# Patient Record
Sex: Female | Born: 1980 | Race: White | Hispanic: No | Marital: Single | Smoking: Current every day smoker
Health system: Southern US, Community
[De-identification: ages and names within clinical notes are randomized; demographics above are authoritative.]

## PROBLEM LIST (undated history)

## (undated) DIAGNOSIS — F32A Depression, unspecified: Secondary | ICD-10-CM

## (undated) DIAGNOSIS — E559 Vitamin D deficiency, unspecified: Secondary | ICD-10-CM

## (undated) DIAGNOSIS — N6320 Unspecified lump in the left breast, unspecified quadrant: Secondary | ICD-10-CM

## (undated) DIAGNOSIS — R319 Hematuria, unspecified: Secondary | ICD-10-CM

## (undated) DIAGNOSIS — F329 Major depressive disorder, single episode, unspecified: Secondary | ICD-10-CM

## (undated) DIAGNOSIS — N898 Other specified noninflammatory disorders of vagina: Secondary | ICD-10-CM

## (undated) DIAGNOSIS — N83209 Unspecified ovarian cyst, unspecified side: Secondary | ICD-10-CM

## (undated) DIAGNOSIS — R309 Painful micturition, unspecified: Secondary | ICD-10-CM

## (undated) HISTORY — PX: TOTAL VAGINAL HYSTERECTOMY: SHX2548

## (undated) HISTORY — DX: Other specified noninflammatory disorders of vagina: N89.8

## (undated) HISTORY — DX: Painful micturition, unspecified: R30.9

## (undated) HISTORY — DX: Hematuria, unspecified: R31.9

## (undated) HISTORY — DX: Major depressive disorder, single episode, unspecified: F32.9

## (undated) HISTORY — DX: Vitamin D deficiency, unspecified: E55.9

## (undated) HISTORY — PX: BREAST BIOPSY: SHX20

## (undated) HISTORY — DX: Depression, unspecified: F32.A

## (undated) HISTORY — DX: Unspecified ovarian cyst, unspecified side: N83.209

## (undated) HISTORY — DX: Unspecified lump in the left breast, unspecified quadrant: N63.20

---

## 2000-12-26 ENCOUNTER — Encounter: Payer: Self-pay | Admitting: *Deleted

## 2000-12-26 ENCOUNTER — Ambulatory Visit (HOSPITAL_COMMUNITY): Admission: RE | Admit: 2000-12-26 | Discharge: 2000-12-26 | Payer: Self-pay | Admitting: *Deleted

## 2001-09-25 ENCOUNTER — Other Ambulatory Visit: Admission: RE | Admit: 2001-09-25 | Discharge: 2001-09-25 | Payer: Self-pay | Admitting: Obstetrics and Gynecology

## 2002-01-10 ENCOUNTER — Ambulatory Visit (HOSPITAL_COMMUNITY): Admission: AD | Admit: 2002-01-10 | Discharge: 2002-01-10 | Payer: Self-pay | Admitting: Obstetrics and Gynecology

## 2002-01-26 ENCOUNTER — Ambulatory Visit (HOSPITAL_COMMUNITY): Admission: RE | Admit: 2002-01-26 | Discharge: 2002-01-26 | Payer: Self-pay | Admitting: Obstetrics and Gynecology

## 2002-03-19 ENCOUNTER — Ambulatory Visit (HOSPITAL_COMMUNITY): Admission: RE | Admit: 2002-03-19 | Discharge: 2002-03-19 | Payer: Self-pay | Admitting: Obstetrics and Gynecology

## 2002-04-01 ENCOUNTER — Ambulatory Visit (HOSPITAL_COMMUNITY): Admission: AD | Admit: 2002-04-01 | Discharge: 2002-04-01 | Payer: Self-pay | Admitting: Obstetrics and Gynecology

## 2002-04-10 ENCOUNTER — Inpatient Hospital Stay (HOSPITAL_COMMUNITY): Admission: AD | Admit: 2002-04-10 | Discharge: 2002-04-13 | Payer: Self-pay | Admitting: Obstetrics and Gynecology

## 2002-07-09 ENCOUNTER — Emergency Department (HOSPITAL_COMMUNITY): Admission: EM | Admit: 2002-07-09 | Discharge: 2002-07-09 | Payer: Self-pay | Admitting: Emergency Medicine

## 2002-07-09 ENCOUNTER — Encounter: Payer: Self-pay | Admitting: Emergency Medicine

## 2002-09-17 ENCOUNTER — Emergency Department (HOSPITAL_COMMUNITY): Admission: EM | Admit: 2002-09-17 | Discharge: 2002-09-17 | Payer: Self-pay | Admitting: Emergency Medicine

## 2003-04-10 ENCOUNTER — Emergency Department (HOSPITAL_COMMUNITY): Admission: EM | Admit: 2003-04-10 | Discharge: 2003-04-10 | Payer: Self-pay | Admitting: Emergency Medicine

## 2003-04-21 ENCOUNTER — Inpatient Hospital Stay (HOSPITAL_COMMUNITY): Admission: EM | Admit: 2003-04-21 | Discharge: 2003-04-24 | Payer: Self-pay | Admitting: Emergency Medicine

## 2003-09-19 ENCOUNTER — Emergency Department (HOSPITAL_COMMUNITY): Admission: EM | Admit: 2003-09-19 | Discharge: 2003-09-19 | Payer: Self-pay | Admitting: Emergency Medicine

## 2003-09-30 ENCOUNTER — Emergency Department (HOSPITAL_COMMUNITY): Admission: EM | Admit: 2003-09-30 | Discharge: 2003-09-30 | Payer: Self-pay | Admitting: Emergency Medicine

## 2003-10-01 ENCOUNTER — Emergency Department (HOSPITAL_COMMUNITY): Admission: EM | Admit: 2003-10-01 | Discharge: 2003-10-02 | Payer: Self-pay | Admitting: Emergency Medicine

## 2004-02-17 ENCOUNTER — Emergency Department (HOSPITAL_COMMUNITY): Admission: EM | Admit: 2004-02-17 | Discharge: 2004-02-17 | Payer: Self-pay | Admitting: Emergency Medicine

## 2005-02-01 ENCOUNTER — Ambulatory Visit (HOSPITAL_COMMUNITY): Admission: RE | Admit: 2005-02-01 | Discharge: 2005-02-01 | Payer: Self-pay | Admitting: Preventative Medicine

## 2005-02-01 IMAGING — CT CT ABDOMEN W/ CM
1 of 3 series · 13 of 32 positions shown, 19 images · IV contrast (CONTRAST)
Comparison: none

CLINICAL DATA: 23-year-old with abdominal pain and fever.  Bilateral pelvic pain.  Diarrhea.
TECHNIQUE: Helical images are performed through the abdomen and pelvis following the administration of oral contrast and during administration of 100 cc Omnipaque 300.  
 CT ABDOMEN WITH CONTRAST:
 Images of the lung bases are unremarkable.  No focal abnormality is seen within the liver, spleen, pancreas, adrenal glands or kidneys.  The gallbladder is present.  No retroperitoneal adenopathy.  Note is made of an umbilical ring.

[Series 251: — · axial · 0.55mm/px · z∈[+1139,+1499]mm · 13 of 84 slices shown, 19 images]
[im 6/84  soft-tissue]
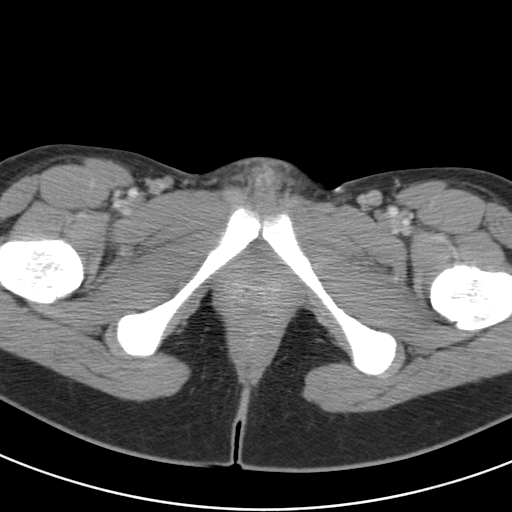
[im 6/84  bone]
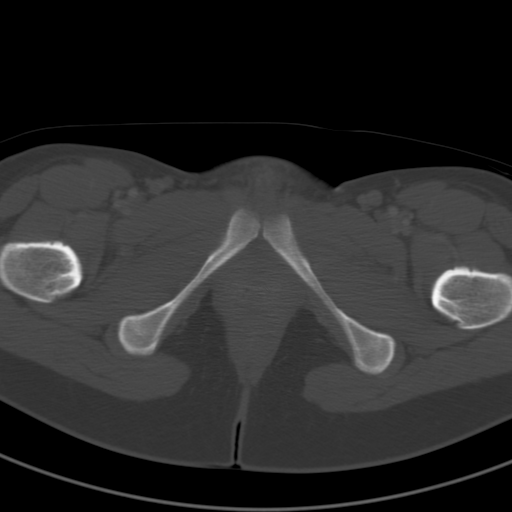
[im 12/84  soft-tissue]
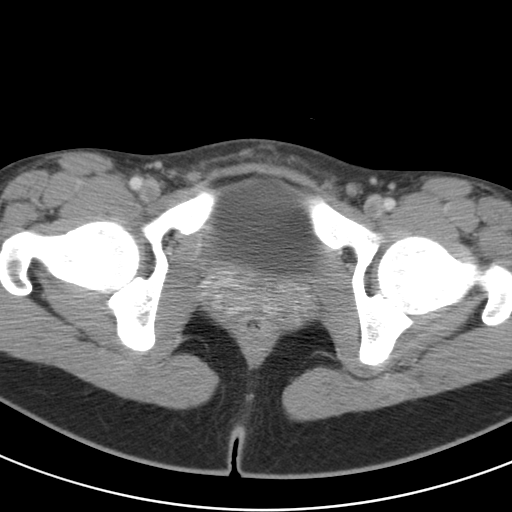
[im 18/84  soft-tissue]
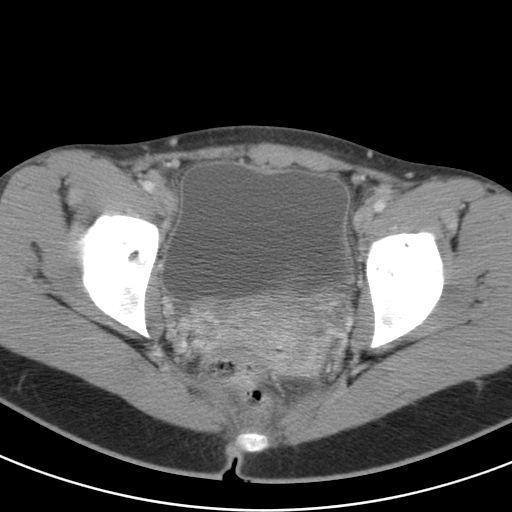
[im 24/84  soft-tissue]
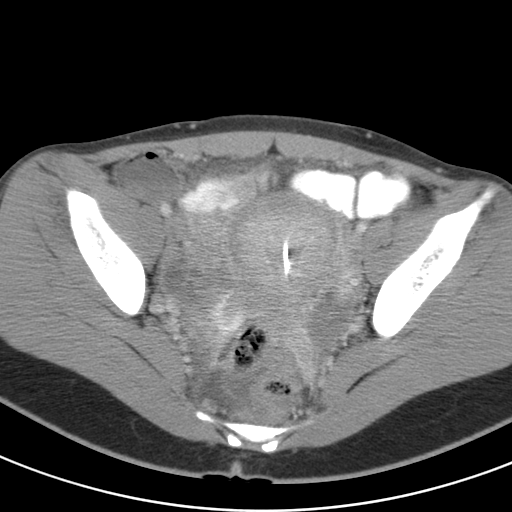
[im 30/84  soft-tissue]
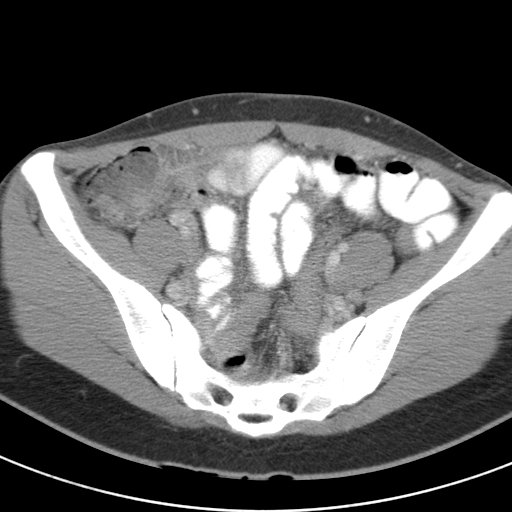
[im 36/84  soft-tissue]
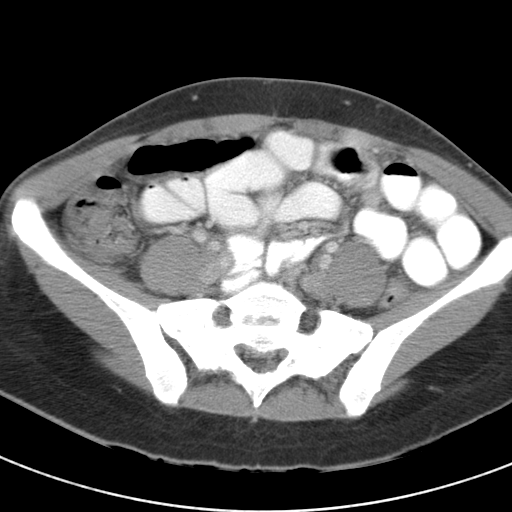
[im 42/84  soft-tissue]
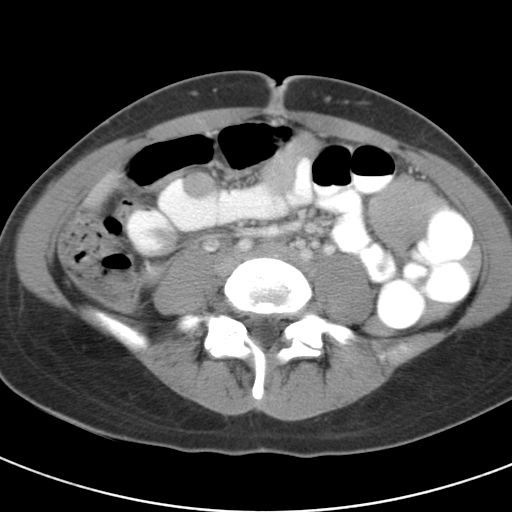
[im 48/84  soft-tissue]
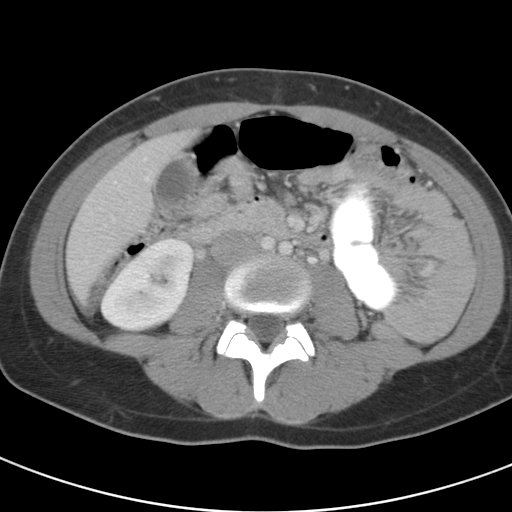
[im 54/84  soft-tissue]
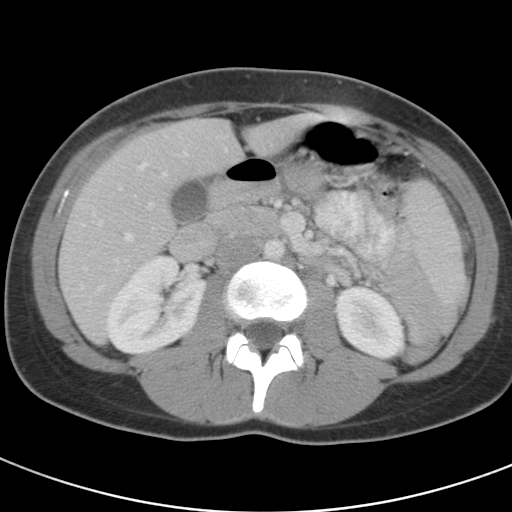
[im 54/84  bone]
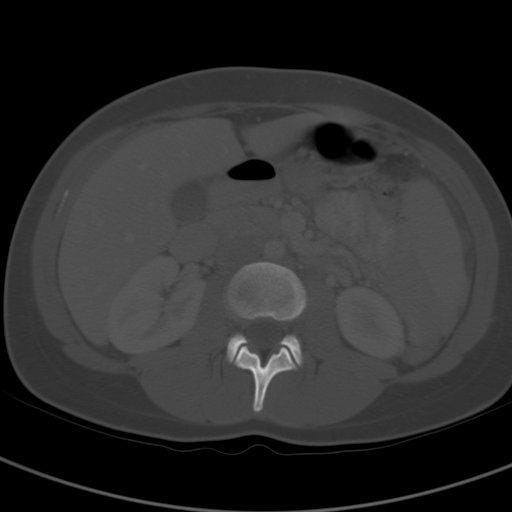
[im 60/84  soft-tissue]
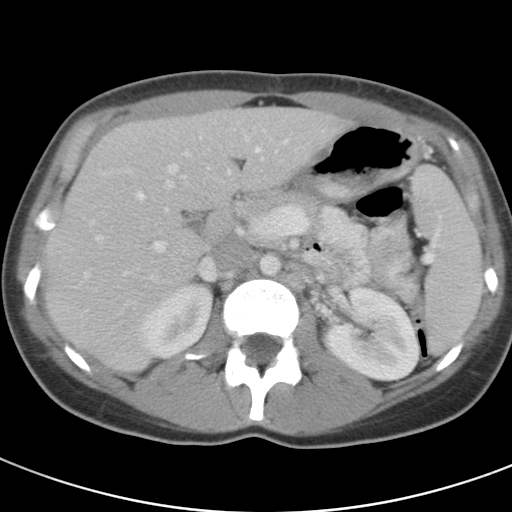
[im 60/84  lung]
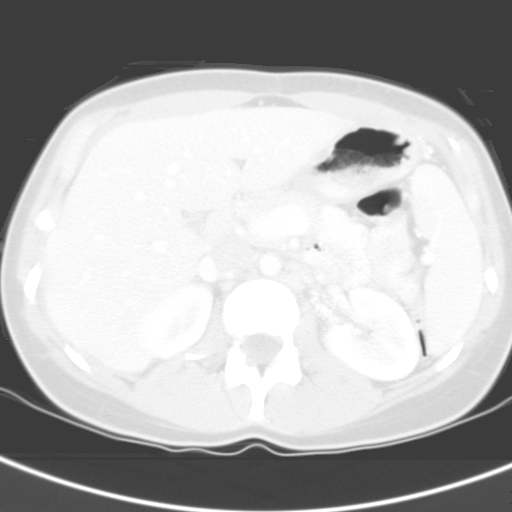
[im 66/84  soft-tissue]
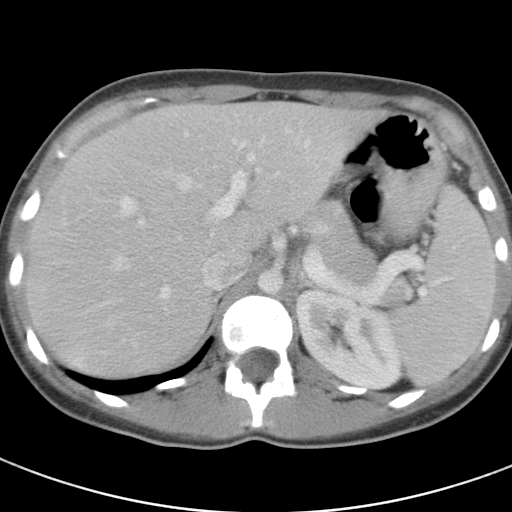
[im 66/84  lung]
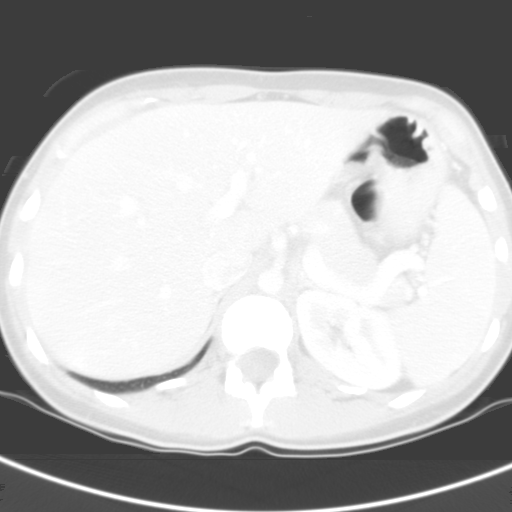
[im 72/84  soft-tissue]
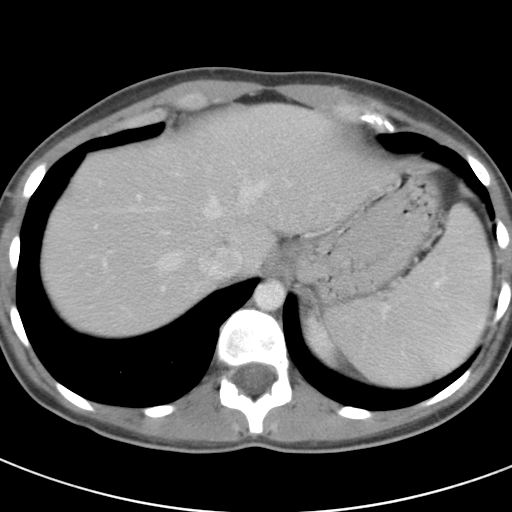
[im 72/84  lung]
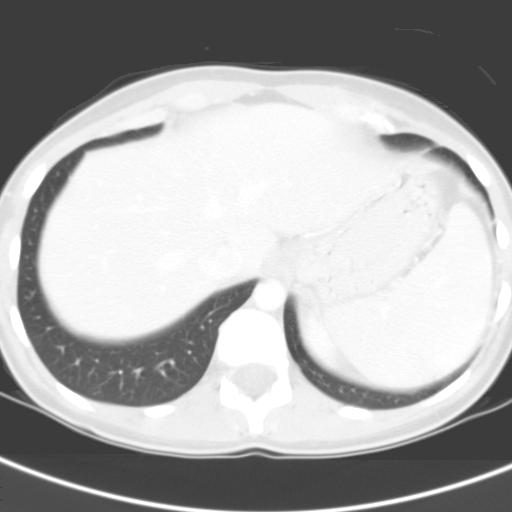
[im 78/84  soft-tissue]
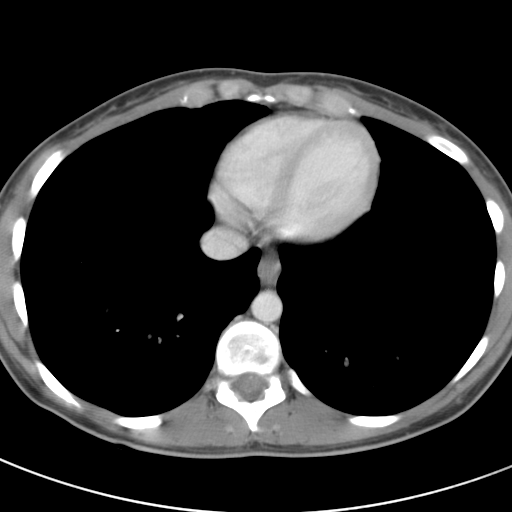
[im 78/84  lung]
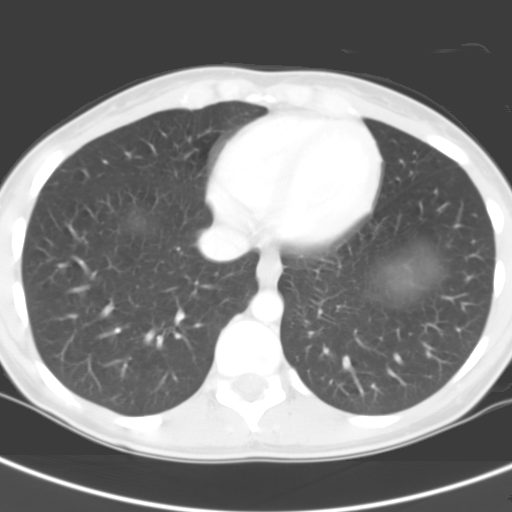

[13 of 32 positions shown; findings below may reference images not displayed]

IMPRESSION: No evidence for acute abnormality of the abdomen.
 CT PELVIS WITH CONTRAST:
 Within the uterus, there is an intrauterine device, which appears to be in good position.  Right ovary is slightly prominent, measuring 4.5 x 4.5 x 4 cm.  Left ovary measures approximately 3.0 x 3.8 cm.  There is a small amount of free pelvic fluid which may be physiologic.  The appendix is not seen but there is no inflammatory change in the right lower quadrant to suggest the presence of acute appendicitis.
IMPRESSION: 1.  Slight prominence of the region of right ovary.  Consider further evaluation with pelvic ultrasound.  
 2.  Small amount of free pelvic fluid may be physiologic.

## 2005-03-15 ENCOUNTER — Encounter: Payer: Self-pay | Admitting: Obstetrics & Gynecology

## 2005-03-15 ENCOUNTER — Ambulatory Visit (HOSPITAL_COMMUNITY): Admission: RE | Admit: 2005-03-15 | Discharge: 2005-03-15 | Payer: Self-pay | Admitting: Obstetrics & Gynecology

## 2005-03-19 ENCOUNTER — Emergency Department (HOSPITAL_COMMUNITY): Admission: EM | Admit: 2005-03-19 | Discharge: 2005-03-20 | Payer: Self-pay | Admitting: Emergency Medicine

## 2005-03-20 ENCOUNTER — Observation Stay (HOSPITAL_COMMUNITY): Admission: EM | Admit: 2005-03-20 | Discharge: 2005-03-20 | Payer: Self-pay | Admitting: Emergency Medicine

## 2005-03-21 ENCOUNTER — Emergency Department (HOSPITAL_COMMUNITY): Admission: EM | Admit: 2005-03-21 | Discharge: 2005-03-21 | Payer: Self-pay | Admitting: Emergency Medicine

## 2005-06-18 ENCOUNTER — Emergency Department (HOSPITAL_COMMUNITY): Admission: EM | Admit: 2005-06-18 | Discharge: 2005-06-19 | Payer: Self-pay | Admitting: Emergency Medicine

## 2005-06-20 ENCOUNTER — Emergency Department (HOSPITAL_COMMUNITY): Admission: EM | Admit: 2005-06-20 | Discharge: 2005-06-20 | Payer: Self-pay | Admitting: Emergency Medicine

## 2006-06-23 ENCOUNTER — Emergency Department (HOSPITAL_COMMUNITY): Admission: EM | Admit: 2006-06-23 | Discharge: 2006-06-23 | Payer: Self-pay | Admitting: Emergency Medicine

## 2006-07-17 ENCOUNTER — Emergency Department (HOSPITAL_COMMUNITY): Admission: EM | Admit: 2006-07-17 | Discharge: 2006-07-17 | Payer: Self-pay | Admitting: Emergency Medicine

## 2006-07-19 ENCOUNTER — Emergency Department (HOSPITAL_COMMUNITY): Admission: EM | Admit: 2006-07-19 | Discharge: 2006-07-19 | Payer: Self-pay | Admitting: Emergency Medicine

## 2006-11-30 ENCOUNTER — Ambulatory Visit (HOSPITAL_COMMUNITY): Admission: RE | Admit: 2006-11-30 | Discharge: 2006-11-30 | Payer: Self-pay | Admitting: Obstetrics and Gynecology

## 2007-05-16 ENCOUNTER — Other Ambulatory Visit: Admission: RE | Admit: 2007-05-16 | Discharge: 2007-05-16 | Payer: Self-pay | Admitting: Obstetrics and Gynecology

## 2008-06-19 ENCOUNTER — Other Ambulatory Visit: Admission: RE | Admit: 2008-06-19 | Discharge: 2008-06-19 | Payer: Self-pay | Admitting: Obstetrics and Gynecology

## 2009-05-07 ENCOUNTER — Ambulatory Visit (HOSPITAL_COMMUNITY): Admission: RE | Admit: 2009-05-07 | Discharge: 2009-05-08 | Payer: Self-pay | Admitting: Obstetrics & Gynecology

## 2009-05-07 ENCOUNTER — Encounter: Payer: Self-pay | Admitting: Obstetrics & Gynecology

## 2009-06-02 ENCOUNTER — Encounter (INDEPENDENT_AMBULATORY_CARE_PROVIDER_SITE_OTHER): Payer: Self-pay | Admitting: General Surgery

## 2009-06-02 ENCOUNTER — Ambulatory Visit (HOSPITAL_COMMUNITY): Admission: RE | Admit: 2009-06-02 | Discharge: 2009-06-02 | Payer: Self-pay | Admitting: General Surgery

## 2010-08-10 ENCOUNTER — Emergency Department (HOSPITAL_COMMUNITY)
Admission: EM | Admit: 2010-08-10 | Discharge: 2010-08-11 | Payer: Self-pay | Source: Home / Self Care | Admitting: Emergency Medicine

## 2010-11-24 LAB — URINALYSIS, ROUTINE W REFLEX MICROSCOPIC
Bilirubin Urine: NEGATIVE
Ketones, ur: NEGATIVE mg/dL
Nitrite: NEGATIVE
Protein, ur: NEGATIVE mg/dL
Urobilinogen, UA: 0.2 mg/dL (ref 0.0–1.0)
pH: 6 (ref 5.0–8.0)

## 2010-11-24 LAB — GC/CHLAMYDIA PROBE AMP, GENITAL: Chlamydia, DNA Probe: NEGATIVE

## 2010-12-19 LAB — URINALYSIS, ROUTINE W REFLEX MICROSCOPIC
Glucose, UA: NEGATIVE mg/dL
Ketones, ur: NEGATIVE mg/dL
Leukocytes, UA: NEGATIVE
Protein, ur: NEGATIVE mg/dL
Urobilinogen, UA: 0.2 mg/dL (ref 0.0–1.0)

## 2010-12-19 LAB — DIFFERENTIAL
Lymphocytes Relative: 33 % (ref 12–46)
Lymphs Abs: 2.6 10*3/uL (ref 0.7–4.0)
Monocytes Relative: 5 % (ref 3–12)
Neutrophils Relative %: 58 % (ref 43–77)

## 2010-12-19 LAB — COMPREHENSIVE METABOLIC PANEL
Albumin: 4.3 g/dL (ref 3.5–5.2)
Alkaline Phosphatase: 49 U/L (ref 39–117)
BUN: 7 mg/dL (ref 6–23)
CO2: 24 mEq/L (ref 19–32)
Chloride: 108 mEq/L (ref 96–112)
Creatinine, Ser: 0.86 mg/dL (ref 0.4–1.2)
GFR calc non Af Amer: >60 mL/min (ref >60)
Potassium: 3.9 mEq/L (ref 3.5–5.1)
Total Bilirubin: 0.6 mg/dL (ref 0.3–1.2)

## 2010-12-19 LAB — TYPE AND SCREEN: ABO/RH(D): O NEG

## 2010-12-19 LAB — CBC
HCT: 42.4 % (ref 36.0–46.0)
Hemoglobin: 15.1 g/dL — ABNORMAL HIGH (ref 12.0–15.0)
MCV: 85.1 fL (ref 78.0–100.0)
Platelets: 120 10*3/uL — ABNORMAL LOW (ref 150–400)
Platelets: 137 10*3/uL — ABNORMAL LOW (ref 150–400)
RBC: 4.18 MIL/uL (ref 3.87–5.11)
RBC: 4.99 MIL/uL (ref 3.87–5.11)
WBC: 6 10*3/uL (ref 4.0–10.5)
WBC: 7.8 10*3/uL (ref 4.0–10.5)

## 2010-12-19 LAB — URINE MICROSCOPIC-ADD ON

## 2011-01-26 NOTE — Op Note (Signed)
NAMEJENNFER, GASSEN             ACCOUNT NO.:  [id number]   MEDICAL RECORD NO.:  [id number]          PATIENT TYPE:  AMB   LOCATION:  DAY                           FACILITY:  [facility]   PHYSICIAN:  Lazaro Arms, M.D.   DATE OF BIRTH:  August 02, 1981   DATE OF PROCEDURE:  05/07/2009  DATE OF DISCHARGE:                               OPERATIVE REPORT   PREOPERATIVE DIAGNOSES:  1. Dysfunctional uterine bleeding.  2. Dysmenorrhea.  3. Chronic right lower quadrant and midline pelvic pain.  4. Dyspareunia.   POSTOPERATIVE DIAGNOSES:  1. Dysfunctional uterine bleeding.  2. Dysmenorrhea.  3. Chronic right lower quadrant and midline pelvic pain.  4. Dyspareunia.   PROCEDURE:  Total vaginal hysterectomy.   SURGEON:  Lazaro Arms, MD   ANESTHESIA:  General endotracheal.   FINDINGS:  Normal uterus, tubes, and ovaries.   DESCRIPTION OF OPERATION:  The patient taken to the operating room,  placed in supine position where she underwent general endotracheal  anesthesia, placed in dorsal supine position in candy cane stirrups.  She was prepped and draped in usual sterile fashion.  A Foley catheter  was placed in the bladder.  A weighted speculum was placed.  Cervix was  grasped.  A 0.5% Marcaine with 1:200,000 epinephrine was injected in the  cervix for hemostasis.  A circumferential incision was made about the  cervix with electrocautery unit.  The posterior cul-de-sac was entered  without difficulty.  The uterus was clamped, cut and suture ligated.  The cardinal ligament was clamped, cut, and suture ligated and held.  The anterior cul-de-sac was entered sharply.  The anterior and posterior  leaves of broad ligament were plicated.  Uterine vessels were clamped,  cut, and suture ligated bilaterally.  Serial pedicles were taken up the  fundus, each pedicle being clamped, cut, and suture ligated.  The left  utero-ovarian ligaments was cross clamped and suture ligated.  Left  ovary was normal.   There was no cyst noted, adhesions, or abnormalities  at all.  No endometriosis and so was left in situ.  The right utero-  ovarian ligaments were cross clamped and sutured.  The infundibulopelvic  ligament on the right was then cross clamped.  The tube and ovary were  removed.  There was good hemostasis.  Pelvis was irrigated vigorously.  The anterior and posterior vagina were closed side-to-side with  interrupted sutures with good hemostasis.  The patient tolerated the  procedure well.  She experienced 100 mL of blood loss, taken to recovery  room in good and stable condition.  All counts correct x3.  She received  Ancef prophylactically.      Lazaro Arms, M.D.  Electronically Signed     LHE/MEDQ  D:  05/07/2009  T:  05/08/2009  Job:  [id number]

## 2011-01-29 NOTE — Discharge Summary (Signed)
Jane Morgan, Jane Morgan                       ACCOUNT NO.:  [id number]   MEDICAL RECORD NO.:  [id number]                   PATIENT TYPE:  INP   LOCATION:  A309                                 FACILITY:  [facility]   PHYSICIAN:  Hanley Hays. Dechurch, M.D.           DATE OF BIRTH:  1981-03-29   DATE OF ADMISSION:  04/21/2003  DATE OF DISCHARGE:  04/24/2003                                 DISCHARGE SUMMARY   DISCHARGE DIAGNOSES:  1. Acute pyelonephritis, cultures pending.  2. Thrombocytopenia.  3. Anxiety disorder.  4. Tobacco abuse.   DISPOSITION:  The patient discharged to home.  Followup with Dr. Lodema Hong in  one week.   DISCHARGE MEDICATIONS:  1. Keflex 500 mg q.i.d. to complete a 2-week course of antibiotics.  2. Darvocet-N 100 q.6-8 hours p.r.n. pain, #30.   HOSPITAL COURSE:  A 30 year old Caucasian female in usual state of health  until about 1 month prior when she developed UTI symptoms.  She was seen in  an urgent care center and given antibiotics.  Apparently her symptoms  improved but then recurred 3 weeks later.  She was seen in the emergency  room and given Levaquin on July 28.  She did not fill the prescription;  however as it turns out she grew E. coli at that time which was resistant to  quinolones.  She presented on August 8 complaining of back pain, fever and  chills.  She had a mild leukocytosis and exam consistent with  pyelonephritis.  Blood and urine cultures were obtained and are pending at  the time of dictation.  She did defervesced from Rocephin.  Based on the  previous cultures she was switched to Keflex.  She insisted on being  discharged versus AMA.  We discharged her home with antibiotics.  She  promised to follow up should there be any recurring fever, chills, pain,  nausea, unable to tolerate antibiotics, hematuria, etc.  She was also noted  to be moderately thrombocytopenic.  Her platelet count was 145 on admission  and 117 at the time of discharge.   She had no evidence of thrombocytopenia  clinically.  This is probably related to sepsis versus antibiotics or  doubtful Vioxx.  She is being discharged with the regimen as noted above.  The plan was discussed with the patient who has reasonable understanding and  it was discussed with a friend who was present as well.   PHYSICAL EXAMINATION:  Exam at time of discharge reveals a somewhat anxious  white female.  Temperature is 97.5, blood pressure is 90/60.  LUNGS:  Clear to auscultation.  She has some left costovertebral angle  tenderness with percussion although this is improved.  She appears to be in  no distress otherwise.  She has no rash, lesion or breakdown.  She has no edema.  NEURO:  Neurological exam at visitation to be somewhat anxious but otherwise  nonfocal.   PLAN:  As  noted above.                                               Hanley Hays Josefine Class, M.D.    FED/MEDQ  D:  04/24/2003  T:  04/24/2003  Job:  [id number]   cc:   Milus Mallick. Lodema Hong, M.D.  [address]  [place], [place] [address]  Fax: [phone]

## 2011-01-29 NOTE — H&P (Signed)
Jane Morgan, Jane Morgan                       ACCOUNT NO.:  [id number]   MEDICAL RECORD NO.:  [id number]                   PATIENT TYPE:  INP   LOCATION:  A417                                 FACILITY:  [facility]   PHYSICIAN:  Tilda Burrow, M.D.              DATE OF BIRTH:  1981-06-22   DATE OF ADMISSION:  04/10/2002  DATE OF DISCHARGE:                                HISTORY & PHYSICAL   ADMISSION DIAGNOSIS:  1. Pregnancy at 38 weeks' gestation.  2. Elective induction secondary to maternal request due to pelvic pressure.   HISTORY OF PRESENT ILLNESS:  This 30 year old female, gravida 5, para 2, AB  2, two prior vaginal deliveries, was admitted after pregnancy course  followed through out office.  Isaiah has been followed in our office through  a straightforward pregnancy course through a multiplicity of visits, 16 to  date.  She has had more pelvic discomfort than average and requests  induction at this time due to insomnia that persists despite two Ambien per  night.  Cervix is favorable at 1-2 cm, 50%, -2, posterior but soft.  The  patient is admitted at 43 weeks' gestation for balloon cervical ripening and  Pitocin induction of labor.  The patient has had two prior vaginal  deliveries at 36 and 37 weeks after eight-hour and four-hour labors,  respectively.  The patient is aware that all the usual risks of pregnancy  management can occur with induced deliveries, including need for emergency  delivery by cesarean or vaginal means.   PAST MEDICAL HISTORY:  Benign.   PAST SURGICAL HISTORY:  D&C, right ovarian cystectomy.   ALLERGIES:  None.   SOCIAL HISTORY:  Lives with boyfriend.  Stable relationship.   PHYSICAL EXAMINATION:  VITAL SIGNS:  Height 5 feet 3 inches, weight 160,  blood pressure 120/63, fundal height 39 cm.  PELVIC:  Cervix 1-2, 50%, -2, posterior and soft.   PRENATAL LABORATORY DATA:  Blood type O negative, with RhoGAM administered  11/28/01.  Pap smear class I.   Rubella immunity present.  Hepatitis, HIV, GC,  Chlamydia, RPR all negative.  Down syndrome risk 1 in 61 with amniocentesis  showing normal XX female infant.    PLAN:  The patient is admitted at this time for Pitocin induction in a.m.  with epidural planned.   Additionally, the patient plans IUD for contraception and to bottle-feed the  infant.                                               Tilda Burrow, M.D.    JVF/MEDQ  D:  04/10/2002  T:  04/11/2002  Job:  [id number]   cc:   [facility]

## 2011-01-29 NOTE — Consult Note (Signed)
[facility]  Patient:    REAGHAN, KAWA Visit Number: [id number] MRN: [id number]          Service Type: OBS Location: 4A A415 01 Attending Physician:  Tilda Burrow Dictated by:   Duane Lope, M.D. Proc. Date: 04/01/02 Admit Date:  04/01/2002 Discharge Date: 04/01/2002                            Consultation Report  The patient is a 30 year old white female, gravida 5, para 2, abortus 3, with an estimated date of delivery of April 25, 2002, at 36-1/[redacted] weeks gestation, complaining of contractions and questionable rupture of membranes.  She was having some mild uterine activity.  The cervix really had not changed any from last week.  It was 2 and 50 in the office last week.  She has a reactive NST. The sterile speculum exam reveals no amniotic fluid visually even with coughing and Valsalva.  Nitrazine negative and fern negative.  She was checked two-and-a-half hours later and was unchanged and actually the contractions had stopped.  She was discharged to home to follow up in the office as scheduled. She was given instructions to call the office or return if needed. Dictated by:   Duane Lope, M.D. Attending Physician:  Tilda Burrow DD:  04/01/02 TD:  04/05/02 Job: [id number]

## 2011-01-29 NOTE — Op Note (Signed)
   NAMEBIANNCA, SCANTLIN                       ACCOUNT NO.:  [id number]   MEDICAL RECORD NO.:  [id number]                   PATIENT TYPE:  INP   LOCATION:  A417                                 FACILITY:  [facility]   PHYSICIAN:  Tilda Burrow, M.D.              DATE OF BIRTH:  1981/04/24   DATE OF PROCEDURE:  04/11/2002  DATE OF DISCHARGE:  04/13/2002                                 OPERATIVE REPORT   PROCEDURE:  Epidural catheter placement.   SURGEON:  Tilda Burrow, M.D.   DETAILS OF PROCEDURE:  The patient was placed in the sitting position and  flexed forward.  The patient had marked difficulty being still due to the  discomforts of labor; she was 6 to 7 cm at the time.  Fluid hydration had  been performed.  The patient had easily identified landmarks.  On the first  attempt at loss-of-resistance technique, I identified an appropriate  epidural space by all indications.  A 5 cc test dose of 1.5% Xylocaine with  epinephrine was instilled through the Tuohy needle and then the epidural  catheter easily inserted 3 cm.  Needle was removed, catheter taped to the  back and infusion continued with a 9 cc bolus at 12 cc/hr.  Interestingly,  the patient had continued discomfort in the abdomen but had marked relief in  the perineal area as evidenced by marked perineal relaxation at time of  subsequent cervical exams.  The patient's pain level subjectively was  slightly improved but tolerated poorly by the patient.  See delivery note  for details of catheter removal.                                               Tilda Burrow, M.D.    JVF/MEDQ  D:  04/13/2002  T:  04/19/2002  Job:  [id number]

## 2011-01-29 NOTE — H&P (Signed)
NAMERHODESIA, STANGER             ACCOUNT NO.:  [id number]   MEDICAL RECORD NO.:  [id number]          PATIENT TYPE:  AMB   LOCATION:  DAY                           FACILITY:  [facility]   PHYSICIAN:  Lazaro Arms, M.D.   DATE OF BIRTH:  11/23/1980   DATE OF ADMISSION:  DATE OF DISCHARGE:  LH                                HISTORY & PHYSICAL   HISTORY OF PRESENT ILLNESS:  Jane Morgan is a 30 year old gravida 5, para 2,  abortus 3, living 2 who has been followed now for the past month or so with  an enlarging ovarian mass.  She has been having right lower quadrant pain  and was seen through the Urgent Care Center and the [facility] and  eventually got back to [place].  On examination she has a 4.6 x 4 ovarian mass  which to me appears to be in the midline which would be the left, I can see  her right ovary and I do not really appreciate the fact that this indeed is  separate from her right ovary though she states her pain is in the right  lower quadrant, to me it is more in the midline and right behind the uterus  and to my feeling it is a left ovarian cyst.  As a result she is admitted  for laparoscopic evaluation to do the appropriate surgery.  She states to  err on the side of removing the whole ovary because she has had it operated  on before and does not want to continue to have problems.  We will do either  an ovarian cystectomy or oophorectomy based on the clinical picture at the  time of surgery and she certainly understands that.  Additionally, she is on  the Mirena IUD, has irregular bleeding with that but that is nothing new.  We have had her own Megace in the past to control irregular bleeding and  that certainly has worked nicely.  Again, she is admitted because of ongoing  acute pain and she is unable to function otherwise.   PAST MEDICAL HISTORY:  Molluscum contagiosum.   PAST SURGICAL HISTORY:  She has had ovarian cyst surgery and D&C's.   PAST OBSTETRICAL:  Three  vaginal deliveries and two missed ABs.   ALLERGIES:  None.   MEDICATIONS:  Medications currently are Vicoprofen for pain and she has a  Mirena IUD.   REVIEW OF SYSTEMS:  As per HPI otherwise negative.   PHYSICAL EXAMINATION:  VITAL SIGNS:  Weight is 125 pounds, blood pressure is  110/70.  HEENT:  Unremarkable.  THYROID:  Normal.  LUNGS:  Clear.  HEART:  Regular rate and rhythm without murmurs, regurgitation, or gallops.  BREASTS:  Without mass, discharge, skin changes.  ABDOMEN:  Benign.  No hepatosplenomegaly or mass.  GU:  She has normal external genitalia.  Vagina is pink and moist without  discharge.  Cervix is parous without lesions.  Uterus normal size.  She is  tender in the mid plane.  Palpation of right lower quadrant is basically  negative to my exam.  EXTREMITIES:  Warm.  No edema.  NEUROLOGIC EXAM:  Grossly intact.   IMPRESSION:  1.  A 4 x 4 cm ovarian cyst, left.  2.  Right lower quadrant pain.   PLAN:  There is some discrepancy between the side I think it is on based on  ultrasound and her symptoms.  It is really midline but I can see her right  ovary clearly on ultrasound.  In any event, we are going to do what is in  Jane Morgan's best interest.  She does say that if there is any question to go  ahead and take the ovary out because she has had so much difficulty in the  past.  We will decide at the time of surgery whether an ovarian cystectomy  or an oophorectomy is most appropriate for her long-term care.  She  understands the risks, benefits, indications, alternatives and will proceed.       LHE/MEDQ  D:  03/14/2005  T:  03/14/2005  Job:  [id number]

## 2011-01-29 NOTE — H&P (Signed)
Jane Morgan, Jane Morgan                       ACCOUNT NO.:  [id number]   MEDICAL RECORD NO.:  [id number]                   PATIENT TYPE:  INP   LOCATION:  A309                                 FACILITY:  [facility]   PHYSICIAN:  Hanley Hays. Dechurch, M.D.           DATE OF BIRTH:  1981-08-03   DATE OF ADMISSION:  04/21/2003  DATE OF DISCHARGE:                                HISTORY & PHYSICAL   HISTORY OF PRESENT ILLNESS:  The patient is a 30 year old Caucasian female  without primary care Jane Morgan with a past history of UTI who was seen in the  emergency room on April 10, 2003.  At that time she received Levaquin and  given a prescription, which she did not fill.  She presents now with a 24-  hour history of progressive left flank pain, chills, fever, nausea.  Her  urinalysis reveals too numerous to count white cells and bacteriuria.  She  has a leukocytosis and a fever of 101.3.  Urine culture on April 10, 2003,  grew E. coli which was resistant to quinolones but sensitive to Rocephin and  Macrodantin.  The patient is being admitted pyelonephritis.   PAST MEDICAL HISTORY/PAST SURGICAL HISTORY:  1. She is gravida 3, para 3, abortus 1.  2. History of ovarian cyst rupture.  3. Adhesiolysis.   SOCIAL HISTORY:  She is unemployed.  Smokes one pack per day.  Denies any  alcohol or drug use.   REVIEW OF SYSTEMS:  Periods are regular.  She denies pregnancy.  She states  I am gay.  No previous history of UTI or pyelonephritis.  No renal  problems.   REVIEW OF SYSTEMS:  Otherwise negative.   FAMILY MEDICAL HISTORY:  Noncontributory to this admission.   PHYSICAL EXAMINATION:  GENERAL:  Well-developed, well-nourished white female  who is in moderate distress secondary to pain.  She is alert and oriented.  VITAL SIGNS:  Temperature is 101.3, blood pressure is 99/62, pulse is 100,  respirations are unlabored.  NECK:  Supple.  No JVD.  HEENT:  Oropharynx is moist.  There is no jaundice noted.  LUNGS:  Clear to auscultation anteriorly and posteriorly.  HEART:  Regular rate and rhythm.  ABDOMEN:  Flat, soft, nontender.  She has significant left flank tenderness  with palpation.  SKIN:  Without rash, lesion, or breakdown.  NEUROLOGIC:  Intact.    ASSESSMENT AND PLAN:  Acute pyelonephritis.  Will begin Rocephin, IV fluids,  and analgesics.  The importance of complying with antibiotic therapy was  discussed with the patient.  Cultures are pending.  The plan was discussed  with the patient, who agrees to stay.                                               Hanley Hays Dechurch,  M.D.    FED/MEDQ  D:  04/22/2003  T:  04/22/2003  Job:  [id number]

## 2011-01-29 NOTE — Op Note (Signed)
NAMEJAYLYNNE, Jane Morgan             ACCOUNT NO.:  [id number]   MEDICAL RECORD NO.:  [id number]          PATIENT TYPE:  AMB   LOCATION:  DAY                           FACILITY:  [facility]   PHYSICIAN:  Lazaro Arms, M.D.   DATE OF BIRTH:  [birth year]   DATE OF PROCEDURE:  04/11/2005  DATE OF DISCHARGE:                                 OPERATIVE REPORT   PREOPERATIVE DIAGNOSES:  1.  Left ovarian cyst, by ultrasound -- simple.  2.  Right lower quadrant pain.   POSTOPERATIVE DIAGNOSES:  1.  Left ovarian cyst, by ultrasound -- simple.  2.  Right lower quadrant pain.   PROCEDURE:  1.  Left ovarian cystectomy.  2.  Lysis of adhesions in the right lower quadrant.   SURGEON:  Lazaro Arms, M.D.   ANESTHESIA:  General endotracheal.   FINDINGS:  The patient had a simple cyst on the left ovary, follicular cyst.  The cyst wall appeared to be normal.  There were no excrescences or other  abnormality.  She did also have adhesions in the right lower quadrant of her  cecum and small amount of ileum.  The appendix was normal; no adhesions of  the appendix.  There was no periappendicitis.  The right ovary was normal.  The rest of the peritoneal cavity was normal.   DESCRIPTION OF OPERATION:  The patient was taken to the operating room and  placed in the supine position, where she underwent general endotracheal  anesthesia.  She was placed in the dorsal lithotomy position; prepped and  draped in the usual sterile fashion.  Foley catheter was placed.  Incision  was made in the umbilicus.  A Veress needle was used and placed in the  peritoneal cavity, one pass without difficulty.  The peritoneal cavity was  confirmed and insufflated.  A nonbladed trocar in direct vision was  employed, and the 10 mm laparoscope was placed in the peritoneal cavity with  one pass without difficulty.  Then 5 mm trocars were placed in the midline  at the suprapubic area, and also in the right lower quadrant under  direct  visualization without difficulty.  The above-noted findings were seen.   The antemesenteric surface of the ovary was opened with the harmonic  scalpel, and a hair-curler technique was used to remove cyst wall.  There  was good hemostasis.  It appeared to be a simple follicular cyst.  There was  good hemostasis after the cyst wall was removed.  The surface of the ovary  was also hemostatic.   There were adhesions of the cecum and distal ileum to the anterior abdominal  wall, maybe from previous adhesions.  It was not a periappendicitis; the  appendix was completely normal.  These were taken down with the harmonic  scalpel without difficulty hemostatically.  The rest of the peritoneal  cavity was normal.   The instruments were removed.  The gas was allowed to escape.  The umbilical  incision fascia was closed with a single 0 Vicryl suture.  It was closed  additionally with 4-0 Vicryl suture x2.  Dermabond was  used on all the  incisions for closure.  Marcaine 0.5% with 1:200,000 epinephrine was  injected as a local anesthetic.   The patient tolerated the procedure well.  She experienced really no blood  loss and was taken to the recovery room in good, stable condition.  Final  counts were correct x3.  She received Ancef and Toradol prophylactically.       LHE/MEDQ  D:  03/15/2005  T:  03/15/2005  Job:  [id number]

## 2011-01-29 NOTE — H&P (Signed)
Jane Morgan, Jane Morgan             ACCOUNT NO.:  [id number]   MEDICAL RECORD NO.:  [id number]          PATIENT TYPE:  OBV   LOCATION:  A417                          FACILITY:  [facility]   PHYSICIAN:  Langley Gauss, MD     DATE OF BIRTH:  17-Apr-1981   DATE OF ADMISSION:  03/20/2005  DATE OF DISCHARGE:  07/08/2006LH                                HISTORY & PHYSICAL   The patient is a 30 year old white female with two prior vaginal deliveries  who presents to [facility] emergency room March 20, 2005 complaining of labial  swelling and bruising in the suprapubic area.  States that the pain has been  worsening since laparoscopic procedure performed on March 15, 2005.   HISTORY:  She has a laparoscopy done outpatient by Dr. Duane Lope, March 15, 2005, at which time she had a cyst removed from the left ovary and an  adhesion from the right ovary.  She states she was discharged home on the  same date of the surgical procedure with no difficulties.  Subsequently,  however, the right lower quadrant incision popped open.  She sought care at  [facility] and was seen March 17, 2005 at which time she states the  incision was opened.  They explored.  A blood clot was removed.  She was  notified that both the deeper layer stitches had popped open, and the skin  glue had become separated.  She states that multilayer closure was performed  at that time in the office setting.  Subsequently, over the subsequent 48  hours she had increased swelling in the labia as well as suprapubically.  Actually she presented to [facility] emergency room the p.m. of March 19, 2005  with the same complaints, at which time I was contacted and advised the  patient be placed on observation status.  The patient, however, declined and  refused hospital admission at that time.  Subsequently turns out due to she  had child care responsibilities.  Subsequently she returned March 20, 2005  with the identical complaints.   PAST  MEDICAL HISTORY:  Prior vaginal deliveries without complications.   ALLERGIES:  She has no known drug allergies.   CURRENT MEDICATIONS:  1.  Advil very rarely for dysmenorrhea.  2.  She does have oxycodone and acetaminophen to take on a p.r.n. basis.  3.  Also has Phenergan for p.r.n. use as well as p.o. Toradol 10 mg p.o.      q.8h. p.r.n.   SOCIAL HISTORY:  She is a cigarette smoker.  Does have a history of alcohol  use, but denies drug use.   REVIEW OF SYSTEMS:  Pertinent for no menstrual problems.  She does have an  IUD in place at this time which has markedly decreased menstrual frequency  as well as heaviness of flow, but pertinently she denies any history of  bleeding disorders.  She denies being a free bleeder.  Denies prior  administration of any transfusions or any blood products.  She states that  since the surgery she has been having bowel movements, but not passing a  lot  of flatus, stating that she has been burping quite a bit.   FAMILY HISTORY:  Her family history likewise is negative for any bleeding  disorders.   PHYSICAL EXAMINATION:  GENERAL: She is noted to be in no acute distress.  I  was contacted March 20, 2005 when the patient presented to the emergency room  for complete care.  VITAL SIGNS: Pulse is 66, respiratory rate 16, blood pressure 117/61.  HEENT: Negative.  No adenopathy.  NECK: Supple.  Thyroid is non palpable.  LUNGS: Clear.  CARDIOVASCULAR: Regular rate and rhythm.  ABDOMEN: Soft, nondistended.  PELVIC: There is obvious suprapubic edema with some bruising.  The incision  site is noted to be well approximated, most specifically the right lower  quadrant which has some interrupted nylon suture visible holding the skin  together, minimal erythema, minimal induration at that incision site.  The  labia bilaterally are dark purple in color, mildly edematous.  No discrete  palpable hematoma, and of note, this is markedly diminished from the p.m.   prior when the patient presented.  She is having no vaginal bleeding.  Bimanual examination: There is no masses appreciated.   LABORATORY STUDIES:  Hemoglobin 11.3, hematocrit 31.9 with a white count of  5.1.  These are essentially unchanged from the emergency room visit p.m.  prior.  HCG is negative, O negative blood type.  APTT is mildly elevated at  46 with baseline at 24 to 37.  The p.m. prior to this, the APTT was noted to  be slightly elevated at 43.  Platelet count normal at 119,000, essentially  unchanged from the p.m. prior.   ASSESSMENT AND PLAN:  A patient with hemodynamic stability with minimal  change in her hemoglobin and hematocrit from the p.m. prior.  She does have  evidence of some hematoma formation suprapubically with some extension down  into the labial area bilaterally with mildly elevated APTTs.  The patient  was referred for observation for continued assessment to see whether this  area of swelling continues to resolve.  The labial portion must have been a  large amount of soft tissue swelling due to its rapid response to  application of ice.  Unclear why her APTT is elevated, whether this is a  primary disorder presenting, as it is very unusual to develop a clot under  the small laparoscopic incision ports, or whether this is due to some type  of limited consumptive coagulopathy due to the hematoma formation.  Nevertheless, the patient is stable at this time.  There appears to be no  active subcutaneous or intraperitoneal bleeding ongoing.  This gave [place] an  opportunity to reassess the patient's coagulation studies to evaluate  whether they will be correcting themselves or will require consultation with  a hematologist.  The patient is referred to the fourth floor.  She is given  general diet.  Laboratory studies additionally requested to be PT/PTT,  fibrin split products.  She will be treated with p.o. Tylox for pain relief, and a pelvic ultrasound can be  performed on March 21, 2005 to look for any  intrapelvic processes ongoing, as she is only 1 week out postoperatively at  this time.   HOSPITAL COURSE:  The patient was referred to the fourth floor, became  apparent fairly early on in her hospital stay that due to her significant  emotional maturity, the nonsmoking policy was going to be a major problem  for this patient.  The nursing staff went  out of their way to try and  accommodate her nicotine addiction issues.  The patient was started  immediately on a nicotine patch.  Ambien was ordered for sleep, and Xanax  was ordered for anxiety panic attack associated with the nicotine policy.  This is a universally applied policy to all patients and employees, and this  patient is not to be considered an exception.  Subsequently, the patient  actually left against medical advice the p.m. of March 20, 2005.  She also  went so far as to refuse to sign the against-medical-advice form.  I  discussed that  with the nursing staff that that should be documented as for charting and  for legal purposes.  The patient did leave the hospital against medical  advice, whether she signs the form or not.  The patient subsequently can  continue followup care with [facility].       DC/MEDQ  D:  03/22/2005  T:  03/22/2005  Job:  [id number]   cc:   Lazaro Arms, M.D.  [address], [address]  [place]  [place] [address]  Fax: [phone]

## 2011-01-29 NOTE — Op Note (Signed)
   NAME:  Jane Morgan, Jane Morgan                       ACCOUNT NO.:  [id number]   MEDICAL RECORD NO.:  [id number]                   PATIENT TYPE:  INP   LOCATION:  A417                                 FACILITY:  [facility]   PHYSICIAN:  Christin Bach, M.D.                 DATE OF BIRTH:  Feb 15, 1981   DATE OF PROCEDURE:  DATE OF DISCHARGE:  04/13/2002                                  PROCEDURE NOTE   PROCEDURE:  The patient was noted to be fully dilated at approximately 8:25  shortly after she had received her epidural.  Her epidural was not offering  her much in the way of relief although her feet had begun to become numb.  She pushed effectively for over 40 minutes with minimal descent of the fetal  head.  It was felt to be in an OA position although there was very little  flexion.  Dr. Emelda Fear was called to attend the delivery for use of the  vacuum. The Kiwi vacuum was applied as far up the fetal head as possible and  with the next contraction, downward traction was exerted on the vacuum.  Over approximately three contractions, the fetal vertex was flexed and  brought under the symphysis pubis where she delivered at 09:24.  The mouth  and nose were suctioned with a bulb syringe and the shoulders were delivered  with the next push. Apgar's were 8 and 9.  The vagina was inspected and no  lacerations were found. Twenty units of Pitocin and 1,000 cc of lactated  Ringers were then infused rapidly IV.  The placenta separated spontaneously  and was delivered by a controlled cord traction at 09:28. It was inspected  and appears to be intact with a three vessel cord.  Estimated blood loss was  300 cc.  The epidural catheter was then removed with the blue tip visualized  as being intact.  The patient tolerated the delivery very well and she plans  to breast feed.     Cathie Beams, CNM                Christin Bach, M.D.    FC/MEDQ  D:  04/11/2002  T:  04/13/2002  Job:  [id number]

## 2013-01-11 ENCOUNTER — Encounter: Payer: Self-pay | Admitting: *Deleted

## 2013-01-12 ENCOUNTER — Other Ambulatory Visit: Payer: Self-pay | Admitting: Adult Health

## 2013-02-02 ENCOUNTER — Other Ambulatory Visit: Payer: Self-pay | Admitting: Adult Health

## 2013-03-05 ENCOUNTER — Encounter: Payer: Self-pay | Admitting: Adult Health

## 2013-03-05 ENCOUNTER — Ambulatory Visit (INDEPENDENT_AMBULATORY_CARE_PROVIDER_SITE_OTHER): Payer: Medicaid Other | Admitting: Adult Health

## 2013-03-05 VITALS — BP 110/68 | HR 72 | Ht 63.0 in | Wt 157.0 lb

## 2013-03-05 DIAGNOSIS — Z0000 Encounter for general adult medical examination without abnormal findings: Secondary | ICD-10-CM

## 2013-03-05 DIAGNOSIS — Z01419 Encounter for gynecological examination (general) (routine) without abnormal findings: Secondary | ICD-10-CM

## 2013-03-05 DIAGNOSIS — R635 Abnormal weight gain: Secondary | ICD-10-CM

## 2013-03-05 DIAGNOSIS — R1031 Right lower quadrant pain: Secondary | ICD-10-CM

## 2013-03-05 DIAGNOSIS — N6320 Unspecified lump in the left breast, unspecified quadrant: Secondary | ICD-10-CM

## 2013-03-05 HISTORY — DX: Unspecified lump in the left breast, unspecified quadrant: N63.20

## 2013-03-05 LAB — CBC
HCT: 42.5 % (ref 36.0–46.0)
Hemoglobin: 14.8 g/dL (ref 12.0–15.0)
MCV: 82.7 fL (ref 78.0–100.0)
Platelets: 168 10*3/uL (ref 150–400)
RBC: 5.14 MIL/uL — ABNORMAL HIGH (ref 3.87–5.11)
WBC: 8.5 10*3/uL (ref 4.0–10.5)

## 2013-03-05 NOTE — Patient Instructions (Addendum)
appt 6/25 at 2pm for 2:15 pm mammogram and left breast US Follow up in 1 week for pelvic US Physical in 1 year

## 2013-03-05 NOTE — Progress Notes (Signed)
Patient ID: DEMIAH Morgan, female   DOB: 1981/04/27, 32 y.o.   MRN: [id number] History of Present Illness: Jane Morgan is a 32 year old white female in for a physical.She complains of weight gain, and mass left breast.   Current Medications, Allergies, Past Medical History, Past Surgical History, Family History and Social History were reviewed in [employer] record.    Review of Systems: Patient denies any headaches, blurred vision, shortness of breath, chest pain, abdominal pain, problems with bowel movements, urination, or intercourse. No joint pain or mood changes. Positives as in HPI  Physical Exam:BP 110/68  Pulse 72  Ht 5\' 3"  (1.6 m)  Wt 157 lb (71.215 kg)  BMI 27.82 kg/m2 General:  Well developed, well nourished, no acute distress Skin:  Warm and dry, tan Neck:  Midline trachea, normal thyroid Lungs; Clear to auscultation bilaterally Breast:  No dominant palpable mass, retraction, or nipple discharge in the right breast, on the left no retraction or nipple discharge but there is a mass at the scar, that is located at 1 o'clock.It feels like a cluster above the scar and? Density a little below the scar, ??scar tissue vs true mass. Cardiovascular: Regular rate and rhythm Abdomen:  Soft, non tender, no hepatosplenomegaly Pelvic:  External genitalia is normal in appearance.  The vagina is normal in appearance. The cervix and uterus are absent.  No   adnexal masses noted, but there is RLQ tenderness on exam and she says it hurts at other times. Extremities:  No swelling or varicosities noted Psych:  Alert and cooperative, seems happy  Impression: Yearly gyn exam-no pap Left breast mass RLQ pain Weight gain  Plan: Diagnostic bilateral mammogram and left breast US at [facility] 6/25 at 2:15 pm Return here in 1 week for pelvic US Check CBC,CMP,TSH and lipids today Physical in 1 year

## 2013-03-06 ENCOUNTER — Telehealth: Payer: Self-pay | Admitting: Adult Health

## 2013-03-06 LAB — COMPREHENSIVE METABOLIC PANEL
ALT: <8 U/L (ref 0–35)
BUN: 8 mg/dL (ref 6–23)
CO2: 25 mEq/L (ref 19–32)
Calcium: 9.7 mg/dL (ref 8.4–10.5)
Chloride: 105 mEq/L (ref 96–112)
Creat: 0.75 mg/dL (ref 0.50–1.10)
Glucose, Bld: 75 mg/dL (ref 70–99)

## 2013-03-06 LAB — LIPID PANEL
Cholesterol: 163 mg/dL (ref 0–200)
HDL: 31 mg/dL — ABNORMAL LOW (ref >39)

## 2013-03-06 LAB — TSH: TSH: 2.161 u[IU]/mL (ref 0.350–4.500)

## 2013-03-06 NOTE — Telephone Encounter (Signed)
Pt aware of labs, and need to increase activity and take krell oil

## 2013-03-07 ENCOUNTER — Ambulatory Visit (HOSPITAL_COMMUNITY)
Admission: RE | Admit: 2013-03-07 | Discharge: 2013-03-07 | Disposition: A | Discharge status: Home / Self Care | Payer: Medicaid Other | Source: Ambulatory Visit | Attending: Adult Health | Admitting: Adult Health

## 2013-03-07 DIAGNOSIS — N6320 Unspecified lump in the left breast, unspecified quadrant: Secondary | ICD-10-CM

## 2013-03-07 DIAGNOSIS — N63 Unspecified lump in unspecified breast: Secondary | ICD-10-CM | POA: Insufficient documentation

## 2013-03-07 DIAGNOSIS — Z803 Family history of malignant neoplasm of breast: Secondary | ICD-10-CM | POA: Insufficient documentation

## 2013-03-14 ENCOUNTER — Ambulatory Visit (INDEPENDENT_AMBULATORY_CARE_PROVIDER_SITE_OTHER): Payer: Medicaid Other

## 2013-03-14 ENCOUNTER — Ambulatory Visit (INDEPENDENT_AMBULATORY_CARE_PROVIDER_SITE_OTHER): Payer: Medicaid Other | Admitting: Adult Health

## 2013-03-14 ENCOUNTER — Encounter: Payer: Self-pay | Admitting: Adult Health

## 2013-03-14 VITALS — BP 120/80 | Ht 63.0 in | Wt 158.0 lb

## 2013-03-14 DIAGNOSIS — N83209 Unspecified ovarian cyst, unspecified side: Secondary | ICD-10-CM | POA: Insufficient documentation

## 2013-03-14 DIAGNOSIS — N949 Unspecified condition associated with female genital organs and menstrual cycle: Secondary | ICD-10-CM

## 2013-03-14 DIAGNOSIS — R1031 Right lower quadrant pain: Secondary | ICD-10-CM

## 2013-03-14 HISTORY — DX: Unspecified ovarian cyst, unspecified side: N83.209

## 2013-03-14 NOTE — Patient Instructions (Addendum)
Ovarian Cyst The ovaries are small organs that are on each side of the uterus. The ovaries are the organs that produce the female hormones, estrogen and progesterone. An ovarian cyst is a sac filled with fluid that can vary in its size. It is normal for a small cyst to form in women who are in the childbearing age and who have menstrual periods. This type of cyst is called a follicle cyst that becomes an ovulation cyst (corpus luteum cyst) after it produces the women's egg. It later goes away on its own if the woman does not become pregnant. There are other kinds of ovarian cysts that may cause problems and may need to be treated. The most serious problem is a cyst with cancer. It should be noted that menopausal women who have an ovarian cyst are at a higher risk of it being a cancer cyst. They should be evaluated very quickly, thoroughly and followed closely. This is especially true in menopausal women because of the high rate of ovarian cancer in women in menopause. CAUSES AND TYPES OF OVARIAN CYSTS:  FUNCTIONAL CYST: The follicle/corpus luteum cyst is a functional cyst that occurs every month during ovulation with the menstrual cycle. They go away with the next menstrual cycle if the woman does not get pregnant. Usually, there are no symptoms with a functional cyst.  ENDOMETRIOMA CYST: This cyst develops from the lining of the uterus tissue. This cyst gets in or on the ovary. It grows every month from the bleeding during the menstrual period. It is also called a "chocolate cyst" because it becomes filled with blood that turns brown. This cyst can cause pain in the lower abdomen during intercourse and with your menstrual period.  CYSTADENOMA CYST: This cyst develops from the cells on the outside of the ovary. They usually are not cancerous. They can get very big and cause lower abdomen pain and pain with intercourse. This type of cyst can twist on itself, cut off its blood supply and cause severe pain. It  also can easily rupture and cause a lot of pain.  DERMOID CYST: This type of cyst is sometimes found in both ovaries. They are found to have different kinds of body tissue in the cyst. The tissue includes skin, teeth, hair, and/or cartilage. They usually do not have symptoms unless they get very big. Dermoid cysts are rarely cancerous.  POLYCYSTIC OVARY: This is a rare condition with hormone problems that produces many small cysts on both ovaries. The cysts are follicle-like cysts that never produce an egg and become a corpus luteum. It can cause an increase in body weight, infertility, acne, increase in body and facial hair and lack of menstrual periods or rare menstrual periods. Many women with this problem develop type 2 diabetes. The exact cause of this problem is unknown. A polycystic ovary is rarely cancerous.  THECA LUTEIN CYST: Occurs when too much hormone (human chorionic gonadotropin) is produced and over-stimulates the ovaries to produce an egg. They are frequently seen when doctors stimulate the ovaries for invitro-fertilization (test tube babies).  LUTEOMA CYST: This cyst is seen during pregnancy. Rarely it can cause an obstruction to the birth canal during labor and delivery. They usually go away after delivery. SYMPTOMS   Pelvic pain or pressure.  Pain during sexual intercourse.  Increasing girth (swelling) of the abdomen.  Abnormal menstrual periods.  Increasing pain with menstrual periods.  You stop having menstrual periods and you are not pregnant. DIAGNOSIS  The diagnosis can   be made during:  Routine or annual pelvic examination (common).  Ultrasound.  X-ray of the pelvis.  CT Scan.  MRI.  Blood tests. TREATMENT   Treatment may only be to follow the cyst monthly for 2 to 3 months with your caregiver. Many go away on their own, especially functional cysts.  May be aspirated (drained) with a long needle with ultrasound, or by laparoscopy (inserting a tube into  the pelvis through a small incision).  The whole cyst can be removed by laparoscopy.  Sometimes the cyst may need to be removed through an incision in the lower abdomen.  Hormone treatment is sometimes used to help dissolve certain cysts.  Birth control pills are sometimes used to help dissolve certain cysts. HOME CARE INSTRUCTIONS  Follow your caregiver's advice regarding:  Medicine.  Follow up visits to evaluate and treat the cyst.  You may need to come back or make an appointment with another caregiver, to find the exact cause of your cyst, if your caregiver is not a gynecologist.  Get your yearly and recommended pelvic examinations and Pap tests.  Let your caregiver know if you have had an ovarian cyst in the past. SEEK MEDICAL CARE IF:   Your periods are late, irregular, they stop, or are painful.  Your stomach (abdomen) or pelvic pain does not go away.  Your stomach becomes larger or swollen.  You have pressure on your bladder or trouble emptying your bladder completely.  You have painful sexual intercourse.  You have feelings of fullness, pressure, or discomfort in your stomach.  You lose weight for no apparent reason.  You feel generally ill.  You become constipated.  You lose your appetite.  You develop acne.  You have an increase in body and facial hair.  You are gaining weight, without changing your exercise and eating habits.  You think you are pregnant. SEEK IMMEDIATE MEDICAL CARE IF:   You have increasing abdominal pain.  You feel sick to your stomach (nausea) and/or vomit.  You develop a fever that comes on suddenly.  You develop abdominal pain during a bowel movement.  Your menstrual periods become heavier than usual. Document Released: 08/30/2005 Document Revised: 11/22/2011 Document Reviewed: 07/03/2009 [facility] Patient Information 2014 [place], [place]. Follow up in 1 week with Dr Despina Hidden

## 2013-03-14 NOTE — Progress Notes (Signed)
Subjective:     Patient ID: Jane Morgan, female   DOB: 1981-02-15, 32 y.o.   MRN: [id number]  HPI Petina is back for pelvic US, the US showed a complex left ovarian cyst.She still has discomfort.  Review of Systems Positives in HPI   Reviewed past medical,surgical, social and family history. Reviewed medications and allergies.  Objective:   Physical Exam BP 120/80  Ht 5\' 3"  (1.6 m)  Wt 158 lb (71.668 kg)  BMI 28 kg/m2 Reviewed [place] with pt., that shows left ovarian complex cyst, will schedule with Dr Despina Hidden to discuss further options, I discussed we could watch and re [place] in 3 months or could remove it. Pt aware of labs and mammogram results.    Assessment:     Complex left ovarian cyst    Plan:     Follow up with Dr Despina Hidden in week to discuss options Review handout on ovarian cyst Get mammogram yearly

## 2013-03-15 ENCOUNTER — Telehealth: Payer: Self-pay | Admitting: *Deleted

## 2013-03-15 NOTE — Telephone Encounter (Signed)
Wait to see Dr Despina Hidden next week, to see if he thinks you need labs

## 2013-03-20 ENCOUNTER — Ambulatory Visit: Payer: Medicaid Other | Admitting: Obstetrics & Gynecology

## 2013-03-28 ENCOUNTER — Ambulatory Visit (INDEPENDENT_AMBULATORY_CARE_PROVIDER_SITE_OTHER): Payer: Medicaid Other | Admitting: Obstetrics & Gynecology

## 2013-03-28 ENCOUNTER — Encounter: Payer: Self-pay | Admitting: Obstetrics & Gynecology

## 2013-03-28 VITALS — BP 110/80 | Wt 155.0 lb

## 2013-03-28 DIAGNOSIS — IMO0002 Reserved for concepts with insufficient information to code with codable children: Secondary | ICD-10-CM

## 2013-03-28 DIAGNOSIS — R1032 Left lower quadrant pain: Secondary | ICD-10-CM

## 2013-03-28 MED ORDER — MEGESTROL ACETATE 40 MG PO TABS: 40.0000 mg | ORAL_TABLET | Freq: Every day | ORAL | Status: DC

## 2013-03-28 NOTE — Progress Notes (Signed)
Patient ID: Jane Morgan, female   DOB: 08-27-1981, 32 y.o.    Jane Morgan is in

## 2013-03-28 NOTE — Patient Instructions (Signed)
Dyspareunia Dyspareunia is pain during sexual intercourse. It is most common in women, but it also happens in men.  CAUSES  Female The pain from this condition is usually felt when anything is put into the vagina, but any part of the genitals may cause pain during sex. Even sitting or wearing pants can cause pain. Sometimes, a cause cannot be found. Some causes of pain during intercourse are:  Infections of the skin around the vagina.  Vaginal infections, such as a yeast, bacterial, or viral infection.  Vaginismus. This is the inability to have anything put in the vagina even when the woman wants it to happen. There is an automatic muscle contraction and pain. The pain of the muscle contraction can be so severe that intercourse is impossible.  Allergic reaction from spermicides, semen, condoms, scented tampons, soaps, douches, and vaginal sprays.  A fluid-filled sac (cyst) on the Bartholin or Skene glands, located at the opening of the vagina.  Scar tissue in the vagina from a surgically enlarged opening (episiotomy) or tearing after delivering a baby.  Vaginal dryness. This is more common in menopause. The normal secretions of the vagina are decreased. Changes in estrogen levels and increased difficulty becoming aroused can cause painful sex. Vaginal dryness can also happen when taking birth control pills.  Thinning of the tissue (atrophy) of the vulva and vagina. This makes the area thinner, smaller, unable to stretch to accommodate a penis, and prone to infection and tearing.  Vulvar vestibulitis or vestibulodynia.This is a condition that causes pain involving the area around the entrance to the vagina.The most common cause in young women is birth control pills.Women with low estrogen levels (postmenopausal women) may also experience this.Other causes include allergic reactions, too many nerve endings, skin conditions, and pelvic muscles that cannot relax.  Vulvar dermatoses. This  includes skin conditions such as lichen sclerosus and lichen planus.  Lack of foreplay to lubricate the vagina. This can cause vaginal dryness.  Noncancerous tumors (fibroids) in the uterus.  Uterus lining tissue growing outside the uterus (endometriosis).  Pregnancy that starts in the fallopian tube (tubal pregnancy).  Pregnancy or breastfeeding your baby. This can cause vaginal dryness.  A tilting or prolapse of the uterus. Prolapse is when weak and stretched muscles around the uterus allow it to fall into the vagina.  Problems with the ovaries, cysts, or scar tissue. This may be worse with certain sexual positions.  Previous surgeries causing adhesions or scar tissue in the vagina or pelvis.  Bladder and intestinal problems.  Psychological problems (such as depression or anxiety). This may make pain worse.  Negative attitudes about sex, experiencing rape, sexual assault, and misinformation about sex. These issues are often related to some types of pain.  Previous pelvic infection, causing scar tissue in the pelvis and on the female organs.  Cyst or tumor on the ovary.  Cancer of the female organs.  Certain medicines.  Medical problems such as diabetes, arthritis, or thyroid disease. Female In men, there are many physical causes of sexual discomfort. Some causes of pain during intercourse are:  Infections of the prostate, bladder, or seminal vesicles. This can cause pain after ejaculation.  An inflamed bladder (interstitial cystitis). This may cause pain from ejaculation.  Gonorrheal infections. This may cause pain during ejaculation.  An inflamed urethra (urethritis) or inflamed prostate (prostatitis). This can make genital stimulation painful or uncomfortable.  Deformities of the penis, such as Peyronie's disease.  A tight foreskin.  Cancer of the female organs.    Psychological problems. This may make pain worse. DIAGNOSIS   Your caregiver will take a history and  have you describe where the pain is located (outside the vagina, in the vagina, in the pelvis). You may be asked when you experience pain, such as with penetration or with thrusting.  Following this, your caregiver will do a physical exam. Let your caregiver know if the exam is too painful.  During the final part of the female exam, your caregiver will feel your uterus and ovaries with one hand on the abdomen and one finger in your vagina. This is a pelvic exam.  Blood tests, a Pap test, cultures for infection, an ultrasound test, and X-rays may be done. You may need to see a specialist for female problems (gynecologist).  Your caregiver may do a CT scan, MRI, or laparoscopy. Laparoscopy is a procedure to look into the pelvis with a lighted tube, through a cut (incision) in the abdomen. TREATMENT  Your caregiver can help you determine the best course of treatment. Sometimes, more testing is done. Continue with the suggested testing until your caregiver feels sure about your diagnosis and how to treat it. Sometimes, it is difficult to find the reason for the pain. The search for the cause and treatment can be frustrating. Treatment often takes several weeks to a few months before you notice any improvement. You may also need to avoid sexual activity until symptoms improve.Continuing to have sex when it hurts can delay healing and actually make the problem worse. The treatment depends on the cause of the pain. Treatment may include:  Medicines such as antibiotics, vaginal or skin creams, hormones, or antidepressants.  Minor or major surgery.  Psychological counseling or group therapy.  Kegel exercises and vaginal dilators to help certain cases of vaginismus (spasms). Do this only if recommended by your caregiver.Kegel exercises can make some problems worse.  Applying lubrication as recommended by your caregiver if you have dryness.  Sex therapy for you and your sex partner. It is common for  the pain to continue after the reason for the pain has been treated. Some reasons for this include a conditioned response. This means the person having the pain becomes so familiar with the pain that the pain continues as a response, even though the cause is removed. Sex therapy can help with this problem. HOME CARE INSTRUCTIONS   Follow your caregiver's instructions about taking medicines, tests, counseling, and follow-up treatment.  Do not use scented tampons, douches, vaginal sprays, or soaps.  Use water-based lubricants for dryness. Oil lubricants can cause irritation.  Do not use spermicides or condoms that irritate you.  Openly discuss with your partner your sexual experience, your desires, foreplay, and different sexual positions for a more comfortable and enjoyable sexual relationship.  Join group sessions for therapy, if needed.  Practice safe sex at all times.  Empty your bladder before having intercourse.  Try different positions during sexual intercourse.  Take over-the-counter pain medicine recommended by your caregiver before having sexual intercourse.  Do not wear pantyhose. Knee-high and thigh-high hose are okay.  Avoid scrubbing your vulva with a washcloth. Wash the area gently and pat dry with a towel. SEEK MEDICAL CARE IF:   You develop vaginal bleeding after sexual intercourse.  You develop a lump at the opening of your vagina, even if it is not painful.  You have abnormal vaginal discharge.  You have vaginal dryness.  You have itching or irritation of the vulva or vagina.  You   develop a rash or reaction to your medicine. SEEK IMMEDIATE MEDICAL CARE IF:   You develop severe abdominal pain during or shortly after sexual intercourse. You could have a ruptured ovarian cyst or ruptured tubal pregnancy.  You have a fever.  You have painful or bloody urination.  You have painful sexual intercourse, and you never had it before.  You pass out after having  sexual intercourse. Document Released: 09/19/2007 Document Revised: 11/22/2011 Document Reviewed: 11/30/2010 [employer] Patient Information 2014 [employer].  

## 2013-05-29 ENCOUNTER — Ambulatory Visit: Payer: Medicaid Other | Admitting: Obstetrics & Gynecology

## 2013-06-25 ENCOUNTER — Ambulatory Visit (INDEPENDENT_AMBULATORY_CARE_PROVIDER_SITE_OTHER): Payer: Medicaid Other | Admitting: Adult Health

## 2013-06-25 ENCOUNTER — Encounter (INDEPENDENT_AMBULATORY_CARE_PROVIDER_SITE_OTHER): Payer: Self-pay

## 2013-06-25 ENCOUNTER — Encounter: Payer: Self-pay | Admitting: Adult Health

## 2013-06-25 VITALS — BP 110/62 | Ht 62.0 in | Wt 156.0 lb

## 2013-06-25 DIAGNOSIS — R300 Dysuria: Secondary | ICD-10-CM

## 2013-06-25 DIAGNOSIS — R319 Hematuria, unspecified: Secondary | ICD-10-CM

## 2013-06-25 DIAGNOSIS — N898 Other specified noninflammatory disorders of vagina: Secondary | ICD-10-CM

## 2013-06-25 DIAGNOSIS — N899 Noninflammatory disorder of vagina, unspecified: Secondary | ICD-10-CM

## 2013-06-25 DIAGNOSIS — R309 Painful micturition, unspecified: Secondary | ICD-10-CM

## 2013-06-25 DIAGNOSIS — B379 Candidiasis, unspecified: Secondary | ICD-10-CM

## 2013-06-25 HISTORY — DX: Painful micturition, unspecified: R30.9

## 2013-06-25 HISTORY — DX: Hematuria, unspecified: R31.9

## 2013-06-25 HISTORY — DX: Other specified noninflammatory disorders of vagina: N89.8

## 2013-06-25 LAB — POCT WET PREP (WET MOUNT)

## 2013-06-25 LAB — POCT URINALYSIS DIPSTICK: Protein, UA: NEGATIVE

## 2013-06-25 MED ORDER — SULFAMETHOXAZOLE-TMP DS 800-160 MG PO TABS: 1.0000 | ORAL_TABLET | Freq: Two times a day (BID) | ORAL | Status: DC

## 2013-06-25 MED ORDER — PHENAZOPYRIDINE HCL 200 MG PO TABS: 200.0000 mg | ORAL_TABLET | Freq: Three times a day (TID) | ORAL | Status: DC | PRN

## 2013-06-25 MED ORDER — FLUCONAZOLE 150 MG PO TABS: ORAL_TABLET | ORAL | Status: DC

## 2013-06-25 NOTE — Patient Instructions (Signed)
Monilial Vaginitis Vaginitis in a soreness, swelling and redness (inflammation) of the vagina and vulva. Monilial vaginitis is not a sexually transmitted infection. CAUSES  Yeast vaginitis is caused by yeast (candida) that is normally found in your vagina. With a yeast infection, the candida has overgrown in number to a point that upsets the chemical balance. SYMPTOMS   White, thick vaginal discharge.  Swelling, itching, redness and irritation of the vagina and possibly the lips of the vagina (vulva).  Burning or painful urination.  Painful intercourse. DIAGNOSIS  Things that may contribute to monilial vaginitis are:  Postmenopausal and virginal states.  Pregnancy.  Infections.  Being tired, sick or stressed, especially if you had monilial vaginitis in the past.  Diabetes. Good control will help lower the chance.  Birth control pills.  Tight fitting garments.  Using bubble bath, feminine sprays, douches or deodorant tampons.  Taking certain medications that kill germs (antibiotics).  Sporadic recurrence can occur if you become ill. TREATMENT  Your caregiver will give you medication.  There are several kinds of anti monilial vaginal creams and suppositories specific for monilial vaginitis. For recurrent yeast infections, use a suppository or cream in the vagina 2 times a week, or as directed.  Anti-monilial or steroid cream for the itching or irritation of the vulva may also be used. Get your caregiver's permission.  Painting the vagina with methylene blue solution may help if the monilial cream does not work.  Eating yogurt may help prevent monilial vaginitis. HOME CARE INSTRUCTIONS   Finish all medication as prescribed.  Do not have sex until treatment is completed or after your caregiver tells you it is okay.  Take warm sitz baths.  Do not douche.  Do not use tampons, especially scented ones.  Wear cotton underwear.  Avoid tight pants and panty  hose.  Tell your sexual partner that you have a yeast infection. They should go to their caregiver if they have symptoms such as mild rash or itching.  Your sexual partner should be treated as well if your infection is difficult to eliminate.  Practice safer sex. Use condoms.  Some vaginal medications cause latex condoms to fail. Vaginal medications that harm condoms are:  Cleocin cream.  Butoconazole (Femstat).  Terconazole (Terazol) vaginal suppository.  Miconazole (Monistat) (may be purchased over the counter). SEEK MEDICAL CARE IF:   You have a temperature by mouth above 102 F (38.9 C).  The infection is getting worse after 2 days of treatment.  The infection is not getting better after 3 days of treatment.  You develop blisters in or around your vagina.  You develop vaginal bleeding, and it is not your menstrual period.  You have pain when you urinate.  You develop intestinal problems.  You have pain with sexual intercourse. Document Released: 06/09/2005 Document Revised: 11/22/2011 Document Reviewed: 02/21/2009 [facility] Patient Information 2014 [place], [place]. Urinary Tract Infection Urinary tract infections (UTIs) can develop anywhere along your urinary tract. Your urinary tract is your body's drainage system for removing wastes and extra water. Your urinary tract includes two kidneys, two ureters, a bladder, and a urethra. Your kidneys are a pair of bean-shaped organs. Each kidney is about the size of your fist. They are located below your ribs, one on each side of your spine. CAUSES Infections are caused by microbes, which are microscopic organisms, including fungi, viruses, and bacteria. These organisms are so small that they can only be seen through a microscope. Bacteria are the microbes that most commonly cause  UTIs. SYMPTOMS  Symptoms of UTIs may vary by age and gender of the patient and by the location of the infection. Symptoms in young women typically  include a frequent and intense urge to urinate and a painful, burning feeling in the bladder or urethra during urination. Older women and men are more likely to be tired, shaky, and weak and have muscle aches and abdominal pain. A fever may mean the infection is in your kidneys. Other symptoms of a kidney infection include pain in your back or sides below the ribs, nausea, and vomiting. DIAGNOSIS To diagnose a UTI, your caregiver will ask you about your symptoms. Your caregiver also will ask to provide a urine sample. The urine sample will be tested for bacteria and white blood cells. White blood cells are made by your body to help fight infection. TREATMENT  Typically, UTIs can be treated with medication. Because most UTIs are caused by a bacterial infection, they usually can be treated with the use of antibiotics. The choice of antibiotic and length of treatment depend on your symptoms and the type of bacteria causing your infection. HOME CARE INSTRUCTIONS  If you were prescribed antibiotics, take them exactly as your caregiver instructs you. Finish the medication even if you feel better after you have only taken some of the medication.  Drink enough water and fluids to keep your urine clear or pale yellow.  Avoid caffeine, tea, and carbonated beverages. They tend to irritate your bladder.  Empty your bladder often. Avoid holding urine for long periods of time.  Empty your bladder before and after sexual intercourse.  After a bowel movement, women should cleanse from front to back. Use each tissue only once. SEEK MEDICAL CARE IF:   You have back pain.  You develop a fever.  Your symptoms do not begin to resolve within 3 days. SEEK IMMEDIATE MEDICAL CARE IF:   You have severe back pain or lower abdominal pain.  You develop chills.  You have nausea or vomiting.  You have continued burning or discomfort with urination. MAKE SURE YOU:   Understand these instructions.  Will watch  your condition.  Will get help right away if you are not doing well or get worse. Document Released: 06/09/2005 Document Revised: 02/29/2012 Document Reviewed: 10/08/2011 [facility] Patient Information 2014 [place], [place]. Take diflucan,septra ds, and pyridium Push water Follow up prn

## 2013-06-25 NOTE — Progress Notes (Signed)
Subjective:     Patient ID: Jane Morgan, female   DOB: 1981-05-15, 32 y.o.   MRN: [id number]  HPI Jane Morgan is a 32 year old white female in complaining of burning with urination and vaginal discharge and irritation, and has low back pain.No new sex partners.   Review of Systems See HPI   Reviewed past medical,surgical, social and family history. Reviewed medications and allergies.  Objective:   Physical Exam BP 110/62  Ht 5\' 2"  (1.575 m)  Wt 156 lb (70.761 kg)  BMI 28.53 kg/m2   Urine trace blood and 2+leuks,  Skin warm and dry.Pelvic: external genitalia is normal in appearance, vagina:greenish discharge without odor, cervix and uterus absent, adnexa: no masses or tenderness noted, tender over bladder. Wet prep: + for yeast and +WBCs.No CVAT but has some low back discomfort. GC/CHL obtained.   Assessment:     Vaginal irration and discharge Painful urination Yeast  Hematuria     Plan:     Rx diflucan 150 mg #2 1 now and 1 in 3 days Rx septra ds #14 1 bid x 7 days Rx pyridium 200 mg #10 1 tid Push fluids Urine sent for UA C&S and GC/CHL Review handout on yeast and UTI

## 2013-06-26 ENCOUNTER — Telehealth: Payer: Self-pay | Admitting: Adult Health

## 2013-06-26 LAB — GC/CHLAMYDIA PROBE AMP
CT Probe RNA: NEGATIVE
GC Probe RNA: NEGATIVE

## 2013-06-26 LAB — URINALYSIS
Bilirubin Urine: NEGATIVE
Nitrite: NEGATIVE
Specific Gravity, Urine: 1.008 (ref 1.005–1.030)
Urobilinogen, UA: 0.2 mg/dL (ref 0.0–1.0)

## 2013-06-26 NOTE — Telephone Encounter (Signed)
No voice mail.

## 2013-06-27 LAB — URINE CULTURE: Colony Count: NO GROWTH

## 2014-01-24 ENCOUNTER — Telehealth: Payer: Self-pay | Admitting: Adult Health

## 2014-01-24 MED ORDER — FLUCONAZOLE 150 MG PO TABS: ORAL_TABLET | ORAL | Status: DC

## 2014-01-24 NOTE — Telephone Encounter (Signed)
Complains of yeast wants diflucan tried OTC will rx diflucan

## 2014-02-01 ENCOUNTER — Telehealth: Payer: Self-pay | Admitting: *Deleted

## 2014-02-01 NOTE — Telephone Encounter (Signed)
Pt states Victorino Dike prescribed diflucan discharge but now having cramping with urination and odor. Pt states she thinks she has bacterial infection. Pt is at [facility], [place] requesting a RX. Pt states will not be back into town until the end of next week.

## 2014-02-01 NOTE — Telephone Encounter (Signed)
Left message that she needed to go to urgent care

## 2014-03-12 ENCOUNTER — Ambulatory Visit (INDEPENDENT_AMBULATORY_CARE_PROVIDER_SITE_OTHER): Payer: Medicaid Other | Admitting: Obstetrics & Gynecology

## 2014-03-12 ENCOUNTER — Encounter: Payer: Self-pay | Admitting: Obstetrics & Gynecology

## 2014-03-12 VITALS — BP 110/80 | Ht 62.0 in | Wt 162.0 lb

## 2014-03-12 DIAGNOSIS — N760 Acute vaginitis: Secondary | ICD-10-CM

## 2014-03-12 MED ORDER — METRONIDAZOLE 500 MG PO TABS: 500.0000 mg | ORAL_TABLET | Freq: Two times a day (BID) | ORAL | Status: DC

## 2014-03-12 NOTE — Progress Notes (Signed)
Patient ID: Jane Morgan, female   DOB: August 06, 1981, 33 y.o.   MRN: [id number] Blood pressure 110/80, height 5\' 2"  (1.575 m), weight 162 lb (73.483 kg).  Pt has had malodorous vaginal discharge for about 6 weeks After taking history probably started after multiple sex encounter weekend  ROS No burning with urination, frequency or urgency No nausea, vomiting or diarrhea Nor fever chills or other constitutional symptoms  NEFG Vagina +greyish discharge with odor noted Cervix no lesions  Wet Prep +BV No yeast No trich  BV Metronidazole 500 BID x 7days

## 2014-07-15 ENCOUNTER — Encounter: Payer: Self-pay | Admitting: Obstetrics & Gynecology

## 2016-08-17 ENCOUNTER — Ambulatory Visit (INDEPENDENT_AMBULATORY_CARE_PROVIDER_SITE_OTHER): Payer: Self-pay | Admitting: Obstetrics & Gynecology

## 2016-08-17 ENCOUNTER — Encounter: Payer: Self-pay | Admitting: Obstetrics & Gynecology

## 2016-08-17 VITALS — BP 118/74 | HR 85 | Ht 62.0 in | Wt 168.8 lb

## 2016-08-17 DIAGNOSIS — Z113 Encounter for screening for infections with a predominantly sexual mode of transmission: Secondary | ICD-10-CM

## 2016-08-17 DIAGNOSIS — R1032 Left lower quadrant pain: Secondary | ICD-10-CM

## 2016-08-17 DIAGNOSIS — R109 Unspecified abdominal pain: Secondary | ICD-10-CM

## 2016-08-17 DIAGNOSIS — R102 Pelvic and perineal pain: Secondary | ICD-10-CM

## 2016-08-18 ENCOUNTER — Telehealth: Payer: Self-pay | Admitting: Obstetrics & Gynecology

## 2016-08-18 NOTE — Telephone Encounter (Signed)
Patient called to let you know she is still having left sided abdominal muscle pain. She states you gave her an injection yesterday but she did not get any relief. Please advise.

## 2016-08-19 LAB — GC/CHLAMYDIA PROBE AMP
CHLAMYDIA, DNA PROBE: NEGATIVE
NEISSERIA GONORRHOEAE BY PCR: NEGATIVE

## 2016-10-24 NOTE — Progress Notes (Signed)
Chief Complaint  Patient presents with  . abdominal cramping, vaginal discharge    Blood pressure 118/74, pulse 85, height 5\' 2"  (1.575 m), weight 168 lb 12.8 oz (76.6 kg).  36 y.o. G6P3 No LMP recorded. Patient has had a hysterectomy. The current method of family planning is status post hysterectomy.  Outpatient Encounter Prescriptions as of 08/17/2016  Medication Sig  . [DISCONTINUED] fluconazole (DIFLUCAN) 150 MG tablet Take 1 now and 1 in 3 days (Patient not taking: Reported on 08/17/2016)  . [DISCONTINUED] metroNIDAZOLE (FLAGYL) 500 MG tablet Take 1 tablet (500 mg total) by mouth 2 (two) times daily. (Patient not taking: Reported on 08/17/2016)  . [DISCONTINUED] phenazopyridine (PYRIDIUM) 200 MG tablet Take 1 tablet (200 mg total) by mouth 3 (three) times daily as needed for pain. (Patient not taking: Reported on 08/17/2016)  . [DISCONTINUED] sulfamethoxazole-trimethoprim (BACTRIM DS) 800-160 MG per tablet Take 1 tablet by mouth 2 (two) times daily. (Patient not taking: Reported on 08/17/2016)   No facility-administered encounter medications on file as of 08/17/2016.     Subjective Pt with lower pelvic abdominal pain Has had hysterectomy ony r ovary left +cornet's sign on exam  Objective Injected with 0.5 marcaine as a diagnostic and therapeutic test Seems abdominal wall  Pertinent ROS   Labs or studies     Impression Diagnoses this Encounter::   ICD-9-CM ICD-10-CM   1. Abdominal wall pain 789.00 R10.9   2. Screening examination for STD (sexually transmitted disease) V74.5 Z11.3 GC/Chlamydia Probe Amp    Established relevant diagnosis(es):   Plan/Recommendations: No orders of the defined types were placed in this encounter.   Labs or Scans Ordered: Orders Placed This Encounter  Procedures  . GC/Chlamydia Probe Amp    Management::   Follow up Return for prn.           All questions were answered.  Past Medical History:  Diagnosis Date    . Depression   . Hematuria 06/25/2013   UA C&S will rx septra ds  . Left breast mass 03/05/2013  . Other and unspecified ovarian cyst 03/14/2013   Complex left ovarian cyst seen on US  . Painful urination 06/25/2013  . Vaginal discharge 06/25/2013  . Vaginal irritation 06/25/2013  . Vitamin D deficiency     Past Surgical History:  Procedure Laterality Date  . BREAST BIOPSY    . TOTAL VAGINAL HYSTERECTOMY      OB History    Gravida Para Term Preterm AB Living   6 3           SAB TAB Ectopic Multiple Live Births                  No Known Allergies  Social History   Social History  . Marital status: Single    Spouse name: N/A  . Number of children: N/A  . Years of education: N/A   Social History Main Topics  . Smoking status: Current Every Day Smoker    Packs/day: 1.00    Years: 15.00    Types: Cigarettes  . Smokeless tobacco: Never Used  . Alcohol use No  . Drug use: No  . Sexual activity: Yes    Birth control/ protection: Surgical   Other Topics Concern  . None   Social History Narrative  . None    Family History  Problem Relation Age of Onset  . Cancer Maternal Grandmother     breast  . Cancer Maternal Aunt  breast  . Ovarian cancer Other   . Lung cancer Other   . Hypertension Other   . Coronary artery disease Other   . Diabetes Other   . Aneurysm Other

## 2017-07-24 ENCOUNTER — Emergency Department (HOSPITAL_COMMUNITY)
Admission: EM | Admit: 2017-07-24 | Discharge: 2017-07-24 | Disposition: A | Discharge status: Home / Self Care | Payer: Worker's Compensation | Attending: Emergency Medicine | Admitting: Emergency Medicine

## 2017-07-24 ENCOUNTER — Encounter (HOSPITAL_COMMUNITY): Payer: Self-pay

## 2017-07-24 DIAGNOSIS — Y92531 Health care provider office as the place of occurrence of the external cause: Secondary | ICD-10-CM | POA: Diagnosis not present

## 2017-07-24 DIAGNOSIS — S0185XA Open bite of other part of head, initial encounter: Secondary | ICD-10-CM | POA: Insufficient documentation

## 2017-07-24 DIAGNOSIS — Y93K9 Activity, other involving animal care: Secondary | ICD-10-CM | POA: Insufficient documentation

## 2017-07-24 DIAGNOSIS — F17210 Nicotine dependence, cigarettes, uncomplicated: Secondary | ICD-10-CM | POA: Diagnosis not present

## 2017-07-24 DIAGNOSIS — W540XXA Bitten by dog, initial encounter: Secondary | ICD-10-CM | POA: Diagnosis not present

## 2017-07-24 DIAGNOSIS — Y990 Civilian activity done for income or pay: Secondary | ICD-10-CM | POA: Insufficient documentation

## 2017-07-24 MED ORDER — TETANUS-DIPHTH-ACELL PERTUSSIS 5-2.5-18.5 LF-MCG/0.5 IM SUSP
0.5000 mL | Freq: Once | INTRAMUSCULAR | Status: AC
  Administered 2017-07-24: 0.5 mL via INTRAMUSCULAR
  Filled 2017-07-24: qty 0.5

## 2017-07-24 MED ORDER — LIDOCAINE HCL (PF) 2 % IJ SOLN
10.0000 mL | Freq: Once | INTRAMUSCULAR | Status: AC
  Administered 2017-07-24: 10 mL

## 2017-07-24 MED ORDER — OXYCODONE-ACETAMINOPHEN 5-325 MG PO TABS
1.0000 | ORAL_TABLET | Freq: Once | ORAL | Status: AC
  Administered 2017-07-24: 1 via ORAL

## 2017-07-24 MED ORDER — ONDANSETRON HCL 4 MG PO TABS
4.0000 mg | ORAL_TABLET | Freq: Once | ORAL | Status: AC
  Administered 2017-07-24: 4 mg via ORAL
  Filled 2017-07-24: qty 1

## 2017-07-24 MED ORDER — AMOXICILLIN-POT CLAVULANATE 875-125 MG PO TABS: 1.0000 | ORAL_TABLET | Freq: Two times a day (BID) | ORAL | 0 refills | Status: DC

## 2017-07-24 MED ORDER — HYDROCODONE-ACETAMINOPHEN 5-325 MG PO TABS: 1.0000 | ORAL_TABLET | ORAL | 0 refills | Status: DC | PRN

## 2017-07-24 MED ORDER — OXYCODONE-ACETAMINOPHEN 5-325 MG PO TABS
ORAL_TABLET | ORAL | Status: AC
  Administered 2017-07-24: 1 via ORAL
  Filled 2017-07-24: qty 1

## 2017-07-24 MED ORDER — AMOXICILLIN-POT CLAVULANATE 875-125 MG PO TABS
1.0000 | ORAL_TABLET | Freq: Once | ORAL | Status: AC
  Administered 2017-07-24: 1 via ORAL
  Filled 2017-07-24: qty 1

## 2017-07-24 MED ORDER — LIDOCAINE HCL (PF) 2 % IJ SOLN
INTRAMUSCULAR | Status: AC
  Administered 2017-07-24: 10 mL
  Filled 2017-07-24: qty 20

## 2017-07-24 NOTE — Discharge Instructions (Signed)
You sustained a dog bite to the lower lip, and an abrasion/puncture to the upper lip.  Your tetanus status was updated today.  You have been started on an antibiotic called Augmentin.  Please use it 2 times daily with meals.  Use Tylenol or ibuprofen for mild pain, use Norco for more severe pain.  Your wound was repaired with dissolvable stitches.  The should come out in the next 5-8 days.  Please let them come out on their own.  Please see your doctor or return to the emergency department immediately if any signs of infection including pus like drainage, red streaks about the face, or hot cherry red areas, or any other signs of advancing infection.  Please use your ice pack to your lip today and tomorrow.

## 2017-07-24 NOTE — ED Triage Notes (Signed)
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  Pt has laceration lower lip and small puncture above top lip.  Staff says rabies vaccines are not up to date on the dog.

## 2017-07-24 NOTE — ED Provider Notes (Signed)
[facility] EMERGENCY DEPARTMENT Provider Note   CSN: [id number] Arrival date & time: 07/24/17  1011     History   Chief Complaint Chief Complaint  Patient presents with  . Animal Bite  . Lip Laceration    HPI Jane Morgan is a 36 y.o. female.  Patient is a 36 year old female who presents to the emergency department with a dog bite to the face.  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  She was assisting with assessing a dog, and the dog bit her in the upper and lower lip.  The patient states that the dog has been quarantined.  Animal control has been notified.  The patient states that she is unsure of the date of her last tetanus shot.  She denies being on any anticoagulation medications.  She has no bleeding disorders.  No previous operations or procedures involving her face.  No other bites or injury reported.      Past Medical History:  Diagnosis Date  . Depression   . Hematuria 06/25/2013   UA C&S will rx septra ds  . Left breast mass 03/05/2013  . Other and unspecified ovarian cyst 03/14/2013   Complex left ovarian cyst seen on US  . Painful urination 06/25/2013  . Vaginal discharge 06/25/2013  . Vaginal irritation 06/25/2013  . Vitamin D deficiency     Patient Active Problem List   Diagnosis Date Noted  . Vaginitis and vulvovaginitis, unspecified 03/12/2014  . Vaginal irritation 06/25/2013  . Vaginal discharge 06/25/2013  . Painful urination 06/25/2013  . Hematuria 06/25/2013  . Other and unspecified ovarian cyst 03/14/2013  . RLQ abdominal pain 03/05/2013  . Left breast mass 03/05/2013    Past Surgical History:  Procedure Laterality Date  . BREAST BIOPSY    . TOTAL VAGINAL HYSTERECTOMY      OB History    Gravida Para Term Preterm AB Living   6 3           SAB TAB Ectopic Multiple Live Births                   Home Medications    Prior to Admission medications   Not on File    Family History Family History    Problem Relation Age of Onset  . Cancer Maternal Grandmother        breast  . Cancer Maternal Aunt        breast  . Ovarian cancer Other   . Lung cancer Other   . Hypertension Other   . Coronary artery disease Other   . Diabetes Other   . Aneurysm Other     Social History Social History   Tobacco Use  . Smoking status: Current Every Day Smoker    Packs/day: 1.00    Years: 15.00    Pack years: 15.00    Types: Cigarettes  . Smokeless tobacco: Never Used  Substance Use Topics  . Alcohol use: No  . Drug use: No     Allergies   Patient has no known allergies.   Review of Systems Review of Systems  Constitutional: Negative for chills and fever.  HENT: Negative for ear pain and sore throat.   Eyes: Negative for pain and visual disturbance.  Respiratory: Negative for cough and shortness of breath.   Cardiovascular: Negative for chest pain and palpitations.  Gastrointestinal: Negative for abdominal pain and vomiting.  Genitourinary: Negative for dysuria and hematuria.  Musculoskeletal: Negative for arthralgias and  back pain.  Skin: Negative for color change and rash.  Neurological: Negative for seizures and syncope.  All other systems reviewed and are negative.    Physical Exam Updated Vital Signs BP (!) 150/94 (BP Location: Right Arm)   Pulse (!) 106   Temp 98.1 F (36.7 C) (Oral)   Resp 17   Ht 5\' 2"  (1.575 m)   Wt 76.2 kg (168 lb)   SpO2 100%   BMI 30.73 kg/m   Physical Exam  Constitutional: She is oriented to person, place, and time. She appears well-developed and well-nourished.  Non-toxic appearance.  HENT:  Head: Normocephalic.  Right Ear: Tympanic membrane and external ear normal.  Left Ear: Tympanic membrane and external ear normal.  Mouth/Throat:    There is a small avulsion of tissue of the lower lip. No loose teeth appreciated.  No injury or trauma to the tongue.  No other facial laceration appreciated.  Eyes: EOM and lids are normal.  Pupils are equal, round, and reactive to light.  Neck: Normal range of motion. Neck supple. Carotid bruit is not present.  Cardiovascular: Normal rate, regular rhythm, normal heart sounds, intact distal pulses and normal pulses.  Pulmonary/Chest: Breath sounds normal. No respiratory distress.  Abdominal: Soft. Bowel sounds are normal. There is no tenderness. There is no guarding.  Musculoskeletal: Normal range of motion.  Lymphadenopathy:       Head (right side): No submandibular adenopathy present.       Head (left side): No submandibular adenopathy present.    She has no cervical adenopathy.  Neurological: She is alert and oriented to person, place, and time. She has normal strength. No cranial nerve deficit or sensory deficit.  Skin: Skin is warm and dry.  Psychiatric: She has a normal mood and affect. Her speech is normal.  Nursing note and vitals reviewed.    ED Treatments / Results  Labs (all labs ordered are listed, but only abnormal results are displayed) Labs Reviewed - No data to display  EKG  EKG Interpretation None       Radiology No results found.  Procedures .Marland KitchenLaceration Repair Date/Time: 07/24/2017 1:11 PM Performed by: Ivery Quale, PA-C Authorized by: Ivery Quale, PA-C   Consent:    Consent obtained:  Verbal   Consent given by:  Patient   Risks discussed:  Infection, pain, poor cosmetic result, poor wound healing and need for additional repair   Alternatives discussed:  Referral (Plastic Surg eval) Universal protocol:    Procedure explained and questions answered to patient or proxy's satisfaction: yes     Immediately prior to procedure, a time out was called: yes     Patient identity confirmed:  Arm band Anesthesia (see MAR for exact dosages):    Anesthesia method:  Nerve block   Block needle gauge:  30 G   Block anesthetic:  Lidocaine 2% w/o epi   Block technique:  Mental Nerve Block   Block injection procedure:  Anatomic landmarks  identified, introduced needle, incremental injection and negative aspiration for blood   Block outcome:  Anesthesia achieved Laceration details:    Location:  Lip   Lip location:  Lower exterior lip   Length (cm):  2.7 Repair type:    Repair type:  Intermediate Pre-procedure details:    Preparation:  Patient was prepped and draped in usual sterile fashion Exploration:    Hemostasis achieved with:  Direct pressure   Wound extent: no foreign bodies/material noted and no nerve damage noted  Wound extent comment:  Lower vermilion border involved.  The puncture to the lower lip with small avulsion area. Treatment:    Wound cleansed with: Saf-Cleanse.   Amount of cleaning:  Standard   Irrigation solution:  Sterile saline   Visualized foreign bodies/material removed: no   Skin repair:    Repair method:  Sutures   Suture size:  6-0   Wound skin closure material used: Vicryl.   Suture technique:  Simple interrupted   Number of sutures:  9 Approximation:    Approximation:  Close   Vermilion border: well-aligned   Post-procedure details:    Dressing:  Open (no dressing)   Patient tolerance of procedure:  Tolerated well, no immediate complications   (including critical care time)  Medications Ordered in ED Medications  oxyCODONE-acetaminophen (PERCOCET/ROXICET) 5-325 MG per tablet 1 tablet (1 tablet Oral Given 07/24/17 1052)     Initial Impression / Assessment and Plan / ED Course  I have reviewed the triage vital signs and the nursing notes.  Pertinent labs & imaging results that were available during my care of the patient were reviewed by me and considered in my medical decision making (see chart for details).       Final Clinical Impressions(s) / ED Diagnoses MDM Vital signs reviewed.  Pulse oximetry is 100% on room air.  Within normal limits by my interpretation.  Patient having a great deal of pain.  Patient treated with Percocet p.o.  Ice pack was provided.  After  the wound was inspected, I explained to the patient that her lower vermilion border was involved, I also explained to her that there was a small avulsion area of tissue of the upper portion of the lower lip.  I discussed with her the scarring process and healing, and that the lower vermilion border may heal smoothly, or may have a slight irregularity.  I also explained to her that if this laceration did not heal in a satisfactory manner that she could be seen by plastics surgery for revision in a few weeks.  Patient was given the option of being [facility] at [facility] for possible plastics referral.  Patient was also given the option to have the general surgeon at this facility to see the patient for repair.  The patient elected to have the wound repaired here by the emergency department staff.  Wound was repaired with 9 interrupted sutures of 6-0 Vicryl.  Patient tolerated the procedure without problem.  The patient will be placed on Augmentin twice daily.  The the dog that bit her has been placed in quarantine, and animal control has been notified.  The patient's tetanus status was updated.  Prescription for Norco given for pain not improved by Tylenol or ibuprofen.  An ice pack is also been provided for the patient. Patient was given strict return instructions concerning advancing infection.  Patient acknowledges understanding of the instructions and is in agreement with the plan.   Final diagnoses:  Dog bite of face, initial encounter    ED Discharge Orders    None       Ivery QualeBryant, Falesha Schommer, PA-C 07/24/17 1342    Mesner, Barbara CowerJason, MD 07/24/17 1556

## 2017-09-15 ENCOUNTER — Ambulatory Visit: Payer: Self-pay | Admitting: General Surgery

## 2018-06-07 ENCOUNTER — Telehealth: Payer: Self-pay | Admitting: *Deleted

## 2018-06-07 NOTE — Telephone Encounter (Signed)
Patient states she has been having cramping in her lower abdomen. Night before last during intercourse, she felt like the "walls of her vagina were bruised". States she has a "mass" on the inside of her vaginal area and is looks like the area is going to close.  Wants to be w/i today to be assessed.  Informed patient we did not have any appts today but could get her in tomorrow.  Verbalized understanding.

## 2018-06-08 ENCOUNTER — Encounter (INDEPENDENT_AMBULATORY_CARE_PROVIDER_SITE_OTHER): Payer: Self-pay

## 2018-06-08 ENCOUNTER — Ambulatory Visit (INDEPENDENT_AMBULATORY_CARE_PROVIDER_SITE_OTHER): Payer: Self-pay | Admitting: Obstetrics & Gynecology

## 2018-06-08 ENCOUNTER — Encounter: Payer: Self-pay | Admitting: Obstetrics & Gynecology

## 2018-06-08 ENCOUNTER — Other Ambulatory Visit: Payer: Self-pay

## 2018-06-08 VITALS — BP 136/77 | HR 77 | Ht 63.0 in | Wt 167.0 lb

## 2018-06-08 DIAGNOSIS — N361 Urethral diverticulum: Secondary | ICD-10-CM

## 2018-06-08 MED ORDER — ESTROGENS, CONJUGATED 0.625 MG/GM VA CREA: TOPICAL_CREAM | VAGINAL | 12 refills | Status: DC

## 2018-06-08 MED ORDER — CIPROFLOXACIN HCL 500 MG PO TABS: 500.0000 mg | ORAL_TABLET | Freq: Two times a day (BID) | ORAL | 0 refills | Status: DC

## 2018-06-08 NOTE — Progress Notes (Signed)
Chief Complaint  Patient presents with  . vaginal mass      37 y.o. G6P3 No LMP recorded. Patient has had a hysterectomy. The current method of family planning is status post hysterectomy.  Outpatient Encounter Medications as of 06/08/2018  Medication Sig  . ciprofloxacin (CIPRO) 500 MG tablet Take 1 tablet (500 mg total) by mouth 2 (two) times daily.  Marland Kitchen conjugated estrogens (PREMARIN) vaginal cream Use 1 gram nightly  . [DISCONTINUED] amoxicillin-clavulanate (AUGMENTIN) 875-125 MG tablet Take 1 tablet every 12 (twelve) hours by mouth.  . [DISCONTINUED] HYDROcodone-acetaminophen (NORCO/VICODIN) 5-325 MG tablet Take 1 tablet every 4 (four) hours as needed by mouth.   No facility-administered encounter medications on file as of 06/08/2018.     Subjective Symptoms began 2-3 weeks ago Vague symptoms, pressure with voiding but not dysuria, just did not feel right, no fever she knows of Symptoms made her think she was getting a UTI but never really fully developed Has off and on lower pelvic cramping anyway and she thought it was similar Had sex 3 days ago and had intense pain in the vagina, stabbing, never had anything like it before Was unsure what to make of it Did self exam and felt a tender grape sized mass in the area, as she describes it, of the urethraa, very tender Again never had similar problem ever before Past Medical History:  Diagnosis Date  . Depression   . Hematuria 06/25/2013   UA C&S will rx septra ds  . Left breast mass 03/05/2013  . Other and unspecified ovarian cyst 03/14/2013   Complex left ovarian cyst seen on [place]  . Painful urination 06/25/2013  . Vaginal discharge 06/25/2013  . Vaginal irritation 06/25/2013  . Vitamin D deficiency     Past Surgical History:  Procedure Laterality Date  . BREAST BIOPSY    . TOTAL VAGINAL HYSTERECTOMY      OB History    Gravida  6   Para  3   Term      Preterm      AB      Living        SAB      TAB        Ectopic      Multiple      Live Births              No Known Allergies  Social History   Socioeconomic History  . Marital status: Single    Spouse name: Not on file  . Number of children: Not on file  . Years of education: Not on file  . Highest education level: Not on file  Occupational History  . Not on file  Social Needs  . Financial resource strain: Not on file  . Food insecurity:    Worry: Not on file    Inability: Not on file  . Transportation needs:    Medical: Not on file    Non-medical: Not on file  Tobacco Use  . Smoking status: Current Every Day Smoker    Packs/day: 1.00    Years: 15.00    Pack years: 15.00    Types: Cigarettes  . Smokeless tobacco: Never Used  Substance and Sexual Activity  . Alcohol use: No  . Drug use: No  . Sexual activity: Yes    Birth control/protection: Surgical  Lifestyle  . Physical activity:    Days per week: Not on file    Minutes per session: Not  on file  . Stress: Not on file  Relationships  . Social connections:    Talks on phone: Not on file    Gets together: Not on file    Attends religious service: Not on file    Active member of club or organization: Not on file    Attends meetings of clubs or organizations: Not on file    Relationship status: Not on file  Other Topics Concern  . Not on file  Social History Narrative  . Not on file    Family History  Problem Relation Age of Onset  . Cancer Maternal Grandmother        breast  . Cancer Maternal Aunt        breast  . Ovarian cancer Other   . Lung cancer Other   . Hypertension Other   . Coronary artery disease Other   . Diabetes Other   . Aneurysm Other     Medications:       Current Outpatient Medications:  .  ciprofloxacin (CIPRO) 500 MG tablet, Take 1 tablet (500 mg total) by mouth 2 (two) times daily., Disp: 20 tablet, Rfl: 0 .  conjugated estrogens (PREMARIN) vaginal cream, Use 1 gram nightly, Disp: 30 g, Rfl: 12  Objective Blood  pressure 136/77, pulse 77, height 5\' 3"  (1.6 m), weight 167 lb (75.8 kg).  General WDWN female NAD Vulva:  normal appearing vulva with no masses, tenderness or lesions Vagina:  Some atrophic changes, mild, cystocoele mild and rectocoele moderate, no mass or swelling is appreciated today I was suspicious of a urethral diverticulum but there is not one However her proximal urethra is exquisitely tender to touch and there is swelling like general soft tissue swelling but no diverticulum is appreciated Cervix:  absent Uterus:  uterus absent Adnexa: ovaries:present, left ovary only, no masses    Pertinent ROS  No nausea, vomiting or diarrhea Nor fever chills or other constitutional symptoms    Labs or studies     Impression Diagnoses this Encounter::   ICD-10-CM   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 ciprofloxacin (CIPRO) 500 MG tablet    Sig: Take 1 tablet (500 mg total) by mouth 2 (two) times daily.    Dispense:  20 tablet    Refill:  0  . conjugated estrogens (PREMARIN) vaginal cream    Sig: Use 1 gram nightly    Dispense:  30 g    Refill:  12    Labs or Scans Ordered: No orders of the defined types were placed in this encounter.   Management:: Ciprofloxacin for 10 days for probable suspected urethral diverticulum that has become acutely infected, urethritis  Follow up visit 2 weeks to re evlauate  Premarin vaginal cream for some tissue changes that are a more chronic issue not acute  Follow up Return in about 2 weeks (around 06/22/2018) for Follow up, with Dr Despina Hidden.     All questions were answered.

## 2018-06-09 ENCOUNTER — Telehealth: Payer: Self-pay | Admitting: Obstetrics & Gynecology

## 2018-06-09 MED ORDER — KETOROLAC TROMETHAMINE 10 MG PO TABS: 10.0000 mg | ORAL_TABLET | Freq: Three times a day (TID) | ORAL | 0 refills | Status: DC | PRN

## 2018-06-09 MED ORDER — HYDROCODONE-ACETAMINOPHEN 5-325 MG PO TABS: 1.0000 | ORAL_TABLET | Freq: Four times a day (QID) | ORAL | 0 refills | Status: DC | PRN

## 2018-06-09 NOTE — Telephone Encounter (Signed)
Patient called stating that she is in a lot of pain today in her pelvic area. Pt states that she thinks it could be from him examining her down there yesterday but she is not sure. Pt would like a call back from Dr. Despina Hidden, pt states that she also sees the bulge on her vaginal opening that Dr. Despina Hidden Did not see yesterday. Pt would like to know if she needs to come in. Please contact pt

## 2018-06-09 NOTE — Telephone Encounter (Signed)
Sent in a script for lortab and toradol so she can pick and choose, they can be taken together

## 2018-06-09 NOTE — Telephone Encounter (Signed)
LMOVM that prescriptions had been sent to pharmacy

## 2018-06-09 NOTE — Telephone Encounter (Signed)
Pt called stating that she is still having a lot of pain in the vaginal area. She states that she can see a bulge that wasn't there during Dr Alyssa Grove exam yesterday. Explained to pt that Dr Despina Hidden feels that the bulge she is seeing is where the diverticulum has filled back up. Advised her that the antibiotics need time to work. Advised that he would prescribe her pain medication if she wishes. She states that she would like to just try using ibuprofen and tylenol. Advised that since we are closed for the weekend, Dr Despina Hidden could send in something for pain in case it increases over the weekend. Pt agrees with that. Advised to call back with any other concerns. Pt verbalized understanding.

## 2018-06-22 ENCOUNTER — Ambulatory Visit (INDEPENDENT_AMBULATORY_CARE_PROVIDER_SITE_OTHER): Payer: Self-pay | Admitting: Obstetrics & Gynecology

## 2018-06-22 ENCOUNTER — Encounter: Payer: Self-pay | Admitting: Obstetrics & Gynecology

## 2018-06-22 VITALS — BP 115/81 | HR 77 | Wt 167.0 lb

## 2018-06-22 DIAGNOSIS — N361 Urethral diverticulum: Secondary | ICD-10-CM

## 2018-06-22 MED ORDER — CIPROFLOXACIN HCL 500 MG PO TABS: 500.0000 mg | ORAL_TABLET | Freq: Two times a day (BID) | ORAL | 0 refills | Status: DC

## 2018-06-22 NOTE — Progress Notes (Signed)
Chief Complaint  Patient presents with  . Follow-up      37 y.o. G6P3 No LMP recorded. Patient has had a hysterectomy. The current method of family planning is status post hysterectomy.  Outpatient Encounter Medications as of 06/22/2018  Medication Sig  . ciprofloxacin (CIPRO) 500 MG tablet Take 1 tablet (500 mg total) by mouth 2 (two) times daily.  Marland Kitchen conjugated estrogens (PREMARIN) vaginal cream Use 1 gram nightly (Patient not taking: Reported on 06/22/2018)  . HYDROcodone-acetaminophen (NORCO/VICODIN) 5-325 MG tablet Take 1 tablet by mouth every 6 (six) hours as needed. (Patient not taking: Reported on 06/22/2018)  . ketorolac (TORADOL) 10 MG tablet Take 1 tablet (10 mg total) by mouth every 8 (eight) hours as needed. (Patient not taking: Reported on 06/22/2018)  . [DISCONTINUED] ciprofloxacin (CIPRO) 500 MG tablet Take 1 tablet (500 mg total) by mouth 2 (two) times daily.   No facility-administered encounter medications on file as of 06/22/2018.     Subjective Pt is seen for follow up 2 weeks ago suspected infection of urethral diverticulum but the day I saw her there was no enlargement just tenderness of the urethra which was severe  Her history was consistent with urethral diverticulum Her pain is significantly better although she continues to have a crampy discomfort She has episodic evcuation she believes Past Medical History:  Diagnosis Date  . Depression   . Hematuria 06/25/2013   UA C&S will rx septra ds  . Left breast mass 03/05/2013  . Other and unspecified ovarian cyst 03/14/2013   Complex left ovarian cyst seen on [place]  . Painful urination 06/25/2013  . Vaginal discharge 06/25/2013  . Vaginal irritation 06/25/2013  . Vitamin D deficiency     Past Surgical History:  Procedure Laterality Date  . BREAST BIOPSY    . TOTAL VAGINAL HYSTERECTOMY      OB History    Gravida  6   Para  3   Term      Preterm      AB      Living        SAB      TAB       Ectopic      Multiple      Live Births              No Known Allergies  Social History   Socioeconomic History  . Marital status: Single    Spouse name: Not on file  . Number of children: Not on file  . Years of education: Not on file  . Highest education level: Not on file  Occupational History  . Not on file  Social Needs  . Financial resource strain: Not on file  . Food insecurity:    Worry: Not on file    Inability: Not on file  . Transportation needs:    Medical: Not on file    Non-medical: Not on file  Tobacco Use  . Smoking status: Current Every Day Smoker    Packs/day: 1.00    Years: 15.00    Pack years: 15.00    Types: Cigarettes  . Smokeless tobacco: Never Used  Substance and Sexual Activity  . Alcohol use: No  . Drug use: No  . Sexual activity: Yes    Birth control/protection: Surgical  Lifestyle  . Physical activity:    Days per week: Not on file    Minutes per session: Not on file  . Stress: Not on file  Relationships  . Social connections:    Talks on phone: Not on file    Gets together: Not on file    Attends religious service: Not on file    Active member of club or organization: Not on file    Attends meetings of clubs or organizations: Not on file    Relationship status: Not on file  Other Topics Concern  . Not on file  Social History Narrative  . Not on file    Family History  Problem Relation Age of Onset  . Cancer Maternal Grandmother        breast  . Cancer Maternal Aunt        breast  . Ovarian cancer Other   . Lung cancer Other   . Hypertension Other   . Coronary artery disease Other   . Diabetes Other   . Aneurysm Other     Medications:       Current Outpatient Medications:  .  ciprofloxacin (CIPRO) 500 MG tablet, Take 1 tablet (500 mg total) by mouth 2 (two) times daily., Disp: 14 tablet, Rfl: 0 .  conjugated estrogens (PREMARIN) vaginal cream, Use 1 gram nightly (Patient not taking: Reported on  06/22/2018), Disp: 30 g, Rfl: 12 .  HYDROcodone-acetaminophen (NORCO/VICODIN) 5-325 MG tablet, Take 1 tablet by mouth every 6 (six) hours as needed. (Patient not taking: Reported on 06/22/2018), Disp: 15 tablet, Rfl: 0 .  ketorolac (TORADOL) 10 MG tablet, Take 1 tablet (10 mg total) by mouth every 8 (eight) hours as needed. (Patient not taking: Reported on 06/22/2018), Disp: 15 tablet, Rfl: 0  Objective Blood pressure 115/81, pulse 77, weight 167 lb (75.8 kg).  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 ciprofloxacin (CIPRO) 500 MG tablet    Sig: Take 1 tablet (500 mg total) by mouth 2 (two) times daily.    Dispense:  14 tablet    Refill:  0    Labs or Scans Ordered: No orders of the defined types were placed in this encounter.   Management:: Will refer to [facility] for needed surgical management She is given a script for cipro to have on hand in case it gets infected again   Follow up Return if symptoms worsen or fail to improve, for Follow up, with Dr Despina Hidden.     All questions were answered.

## 2018-06-27 ENCOUNTER — Telehealth: Payer: Self-pay | Admitting: *Deleted

## 2018-06-27 NOTE — Telephone Encounter (Signed)
LMOVM that referral has been setup but I was unable to schedule appt due to her being self pay.  Will need to pay $100 up front and appt can be made at that time.  Number left on VM for patient to call.  Will fax over office notes.

## 2018-06-28 ENCOUNTER — Telehealth: Payer: Self-pay | Admitting: *Deleted

## 2018-06-28 NOTE — Telephone Encounter (Signed)
Pt called and wanted you to know that to make sure you put on the referral that she needs appointment at the [facility] not [facility] .

## 2018-06-28 NOTE — Telephone Encounter (Signed)
Patient informed that I spoke with [facility] and they stated she was given information for the [facility] discount as they did not know if it would cover surgery. Records have been sent to the "home office" in [place] but will refax information stating it needs to go to [place] office. Advised patient to call them to let them know all information has been sent and to set up appt. Pt verbalized understanding.

## 2018-08-22 ENCOUNTER — Ambulatory Visit (INDEPENDENT_AMBULATORY_CARE_PROVIDER_SITE_OTHER): Payer: Self-pay | Admitting: Urology

## 2018-08-22 DIAGNOSIS — N361 Urethral diverticulum: Secondary | ICD-10-CM

## 2018-08-23 ENCOUNTER — Other Ambulatory Visit: Payer: Self-pay | Admitting: Urology

## 2018-08-23 DIAGNOSIS — N361 Urethral diverticulum: Secondary | ICD-10-CM

## 2018-08-29 ENCOUNTER — Ambulatory Visit (HOSPITAL_COMMUNITY)
Admission: RE | Admit: 2018-08-29 | Discharge: 2018-08-29 | Disposition: A | Discharge status: Home / Self Care | Payer: Self-pay | Source: Ambulatory Visit | Attending: Urology | Admitting: Urology

## 2018-08-29 DIAGNOSIS — N361 Urethral diverticulum: Secondary | ICD-10-CM | POA: Insufficient documentation

## 2018-08-29 IMAGING — MR MR PELVIS WO/W CM
4 of 8 series · 21 of 48 positions shown · IV contrast (agent unspecified)
Comparison: CT abdomen/pelvis dated 02/01/2005

CLINICAL DATA: Pelvic pain x6 months, status post hysterectomy,
urethral diverticulum

EXAM:
MRI PELVIS WITHOUT AND WITH CONTRAST
TECHNIQUE: Multiplanar multisequence MR imaging of the pelvis was performed
both before and after administration of intravenous contrast.
CONTRAST:  7 mL Gadovist IV

[Series 2: t2_tse_tra_p2 · axial · 5.0mm · 0.76mm/px · z∈[-153,+93]mm · 9 of 42 slices shown]
[im 1/42]
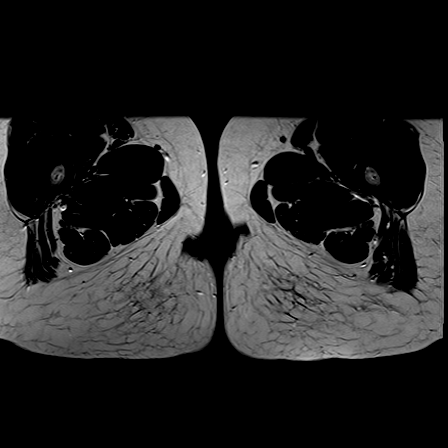
[im 6/42]
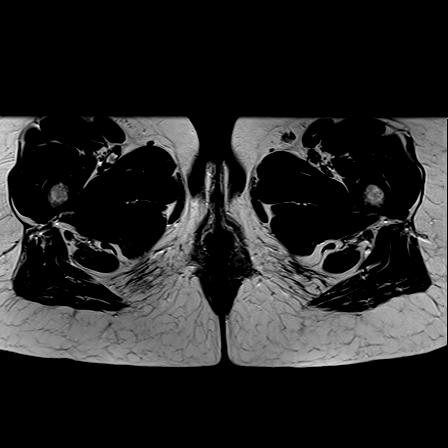
[im 11/42]
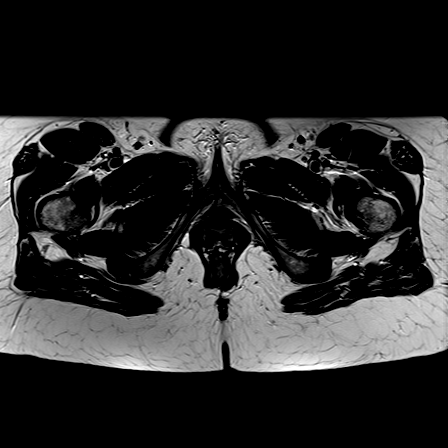
[im 16/42]
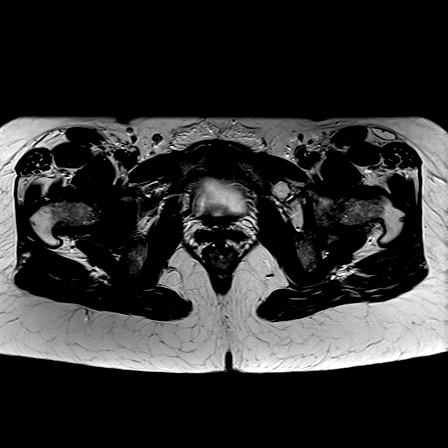
[im 21/42]
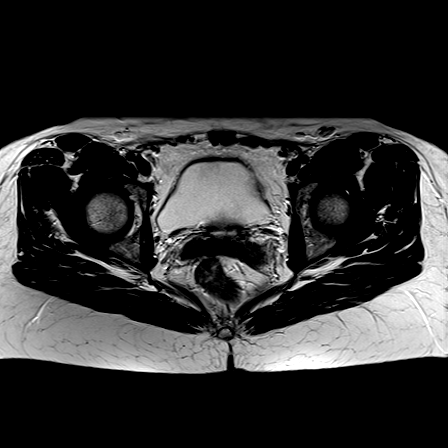
[im 26/42]
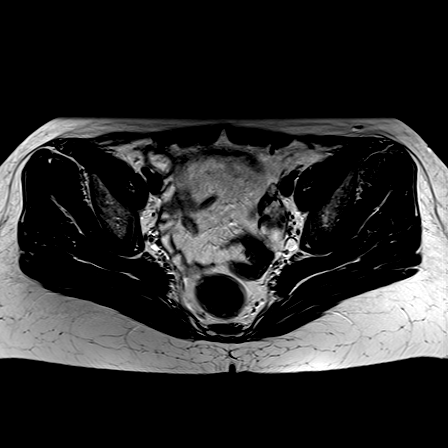
[im 31/42]
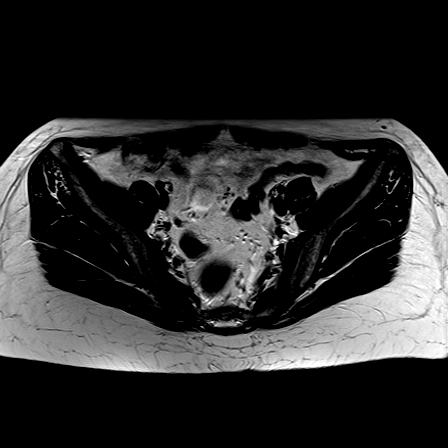
[im 36/42]
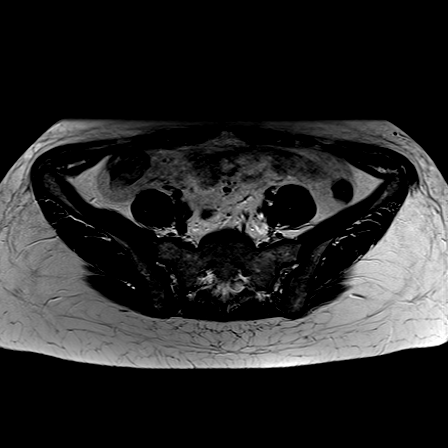
[im 42/42]
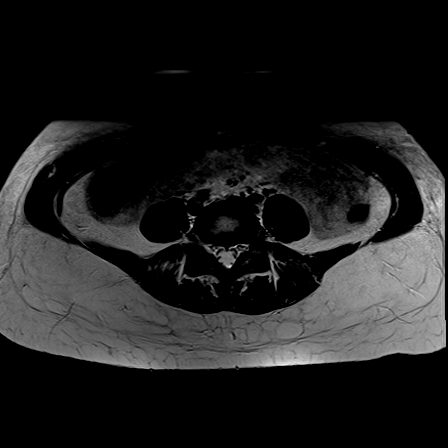

[Series 3: t2_tse_sag_p2 · sagittal · 5.0mm · 0.59mm/px · 6 of 30 slices shown]
[im 1/30]
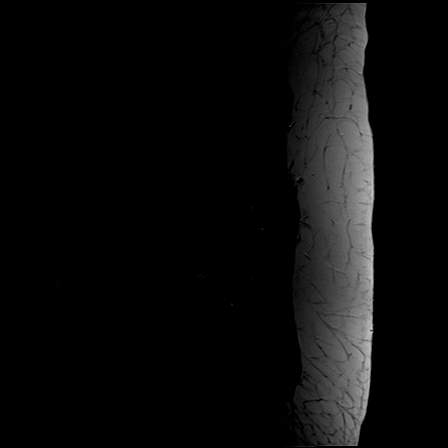
[im 6/30]
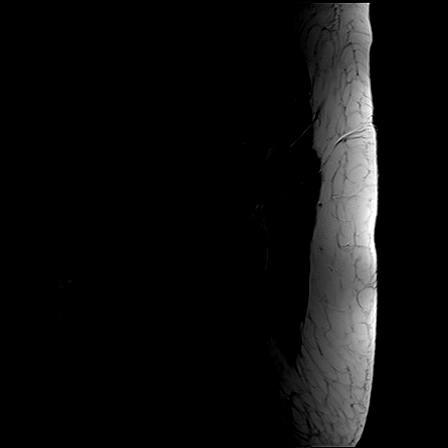
[im 12/30]
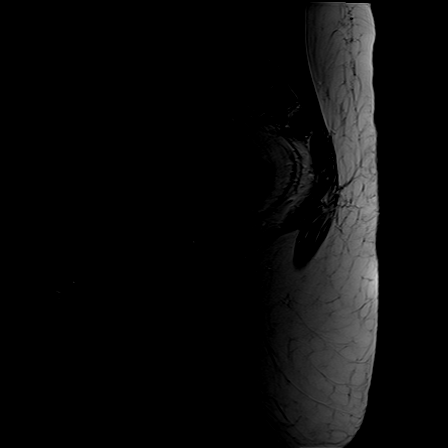
[im 18/30]
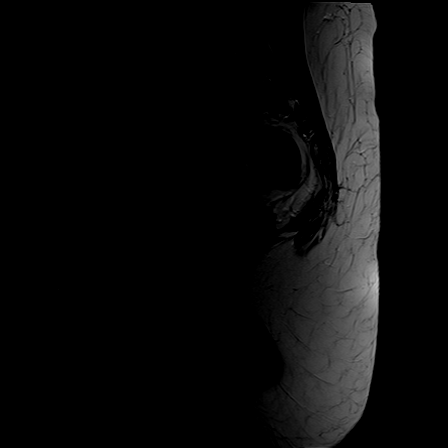
[im 24/30]
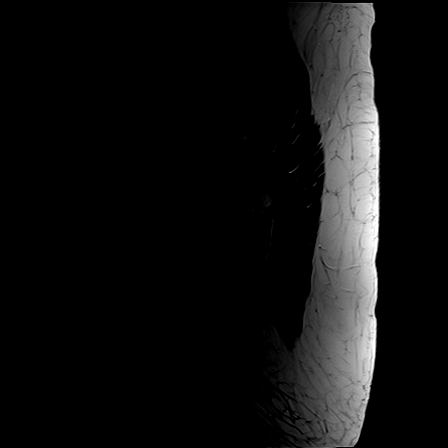
[im 30/30]
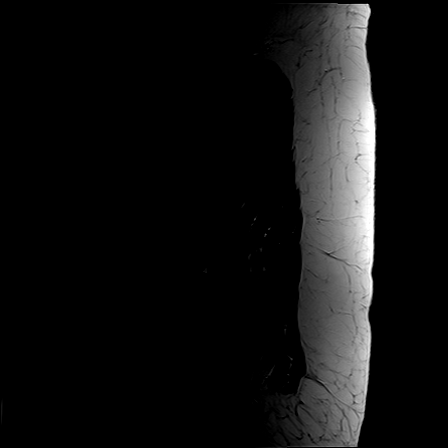

[Series 4: t2_tse fat sat_tra_p2 · axial · 4.0mm · 0.38mm/px · z∈[-118,-7]mm · 3 of 35 slices shown]
[im 7/35]
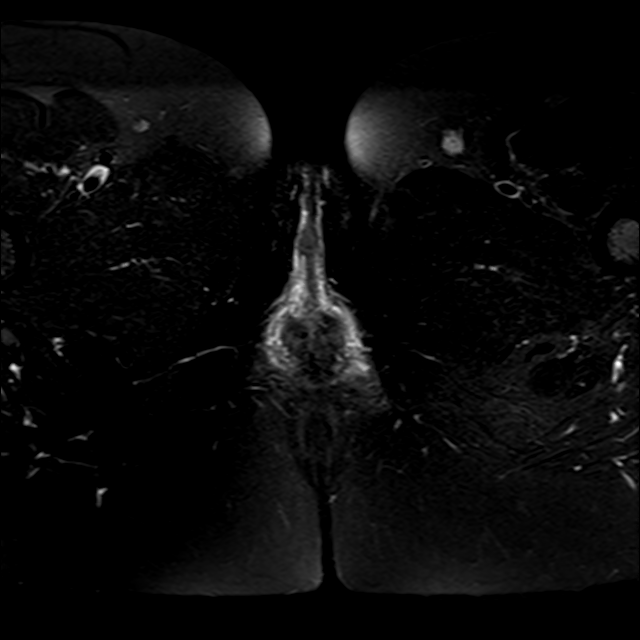
[im 21/35]
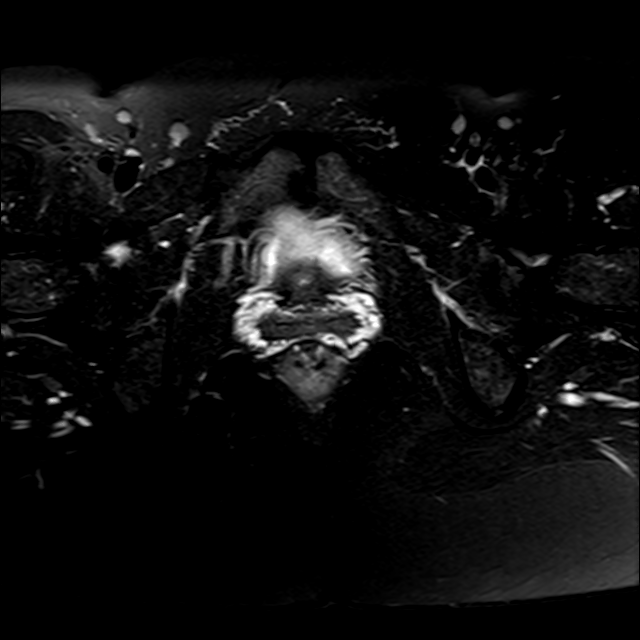
[im 35/35]
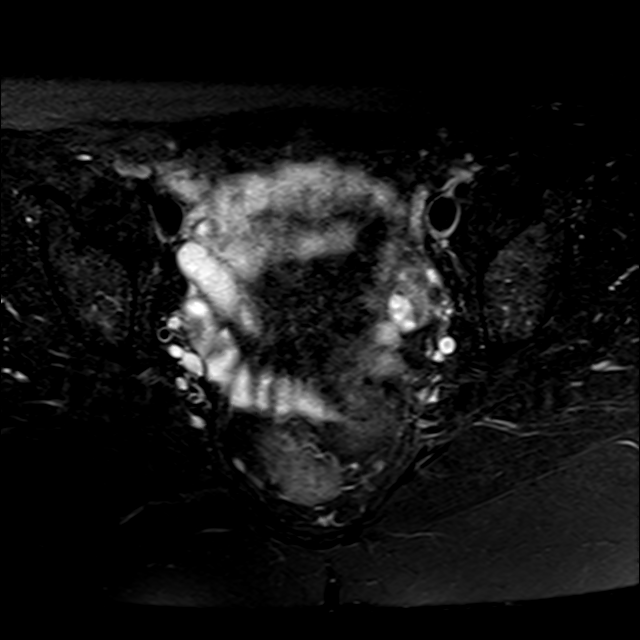

[Series 5: t2fs_tse_cor_p2 · coronal · 4.0mm · 0.33mm/px · 3 of 30 slices shown]
[im 1/30]
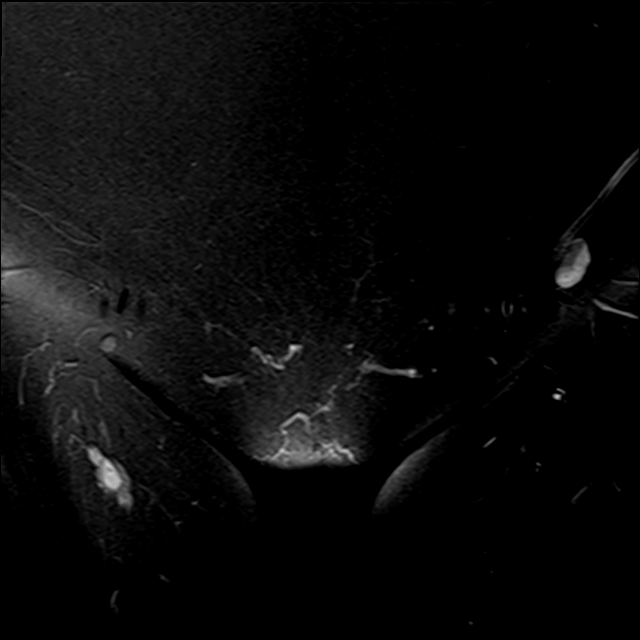
[im 15/30]
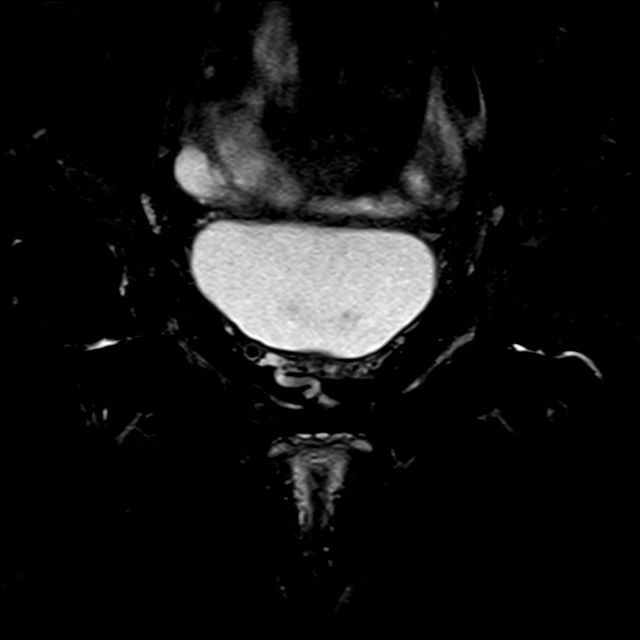
[im 30/30]
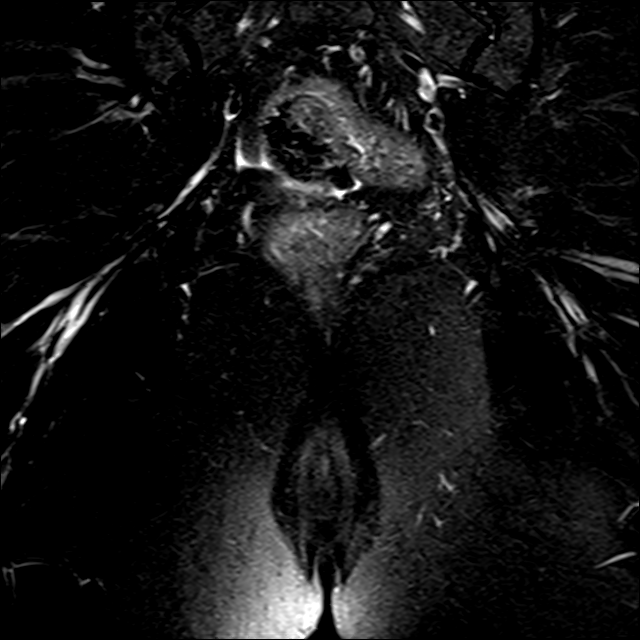

[21 of 48 positions shown; findings below may reference images not displayed]

FINDINGS: Urinary Tract:  Bladder is within normal limits.

Right posterolateral urethral diverticulum (series 2/image 29;
series 3/image 15), extending from the 5 o'clock to 8 o'clock
position, measuring 5 x 10 x 11 mm. In retrospect, a similar finding
was present in 2776.

Bowel:  Visualized bowel is unremarkable.

Vascular/Lymphatic: No evidence of aneurysm.

No suspicious pelvic lymphadenopathy.

Reproductive: Status post hysterectomy and right
salpingo-oophorectomy.

Left ovary is within normal limits, noting a dominant 2.0 cm corpus
luteum (series 2/image 15), physiologic.

Other:  No pelvic ascites.

Musculoskeletal: No focal osseous lesions.  Sacral Tarlov cysts.
IMPRESSION: 11 mm right posterolateral urethral diverticulum, as described
above, grossly unchanged from 2776.

## 2018-08-29 MED ORDER — GADOBUTROL 1 MMOL/ML IV SOLN
7.0000 mL | Freq: Once | INTRAVENOUS | Status: AC | PRN
  Administered 2018-08-29: 7 mL via INTRAVENOUS

## 2018-09-01 ENCOUNTER — Other Ambulatory Visit: Payer: Self-pay | Admitting: Obstetrics & Gynecology

## 2019-11-19 ENCOUNTER — Ambulatory Visit (HOSPITAL_COMMUNITY)
Admission: RE | Admit: 2019-11-19 | Discharge: 2019-11-19 | Disposition: A | Discharge status: Home / Self Care | Payer: 59 | Source: Ambulatory Visit | Attending: Physician Assistant | Admitting: Physician Assistant

## 2019-11-19 ENCOUNTER — Other Ambulatory Visit: Payer: Self-pay

## 2019-11-19 ENCOUNTER — Other Ambulatory Visit (HOSPITAL_COMMUNITY): Payer: Self-pay | Admitting: Physician Assistant

## 2019-11-19 DIAGNOSIS — M5416 Radiculopathy, lumbar region: Secondary | ICD-10-CM

## 2019-11-19 IMAGING — DX DG LUMBAR SPINE 2-3V
3 series · 3 of 3 positions shown · non-contrast
Comparison: None.

CLINICAL DATA: Lumbar radiculopathy

EXAM:
LUMBAR SPINE - 2-3 VIEW

[l-spine ap]
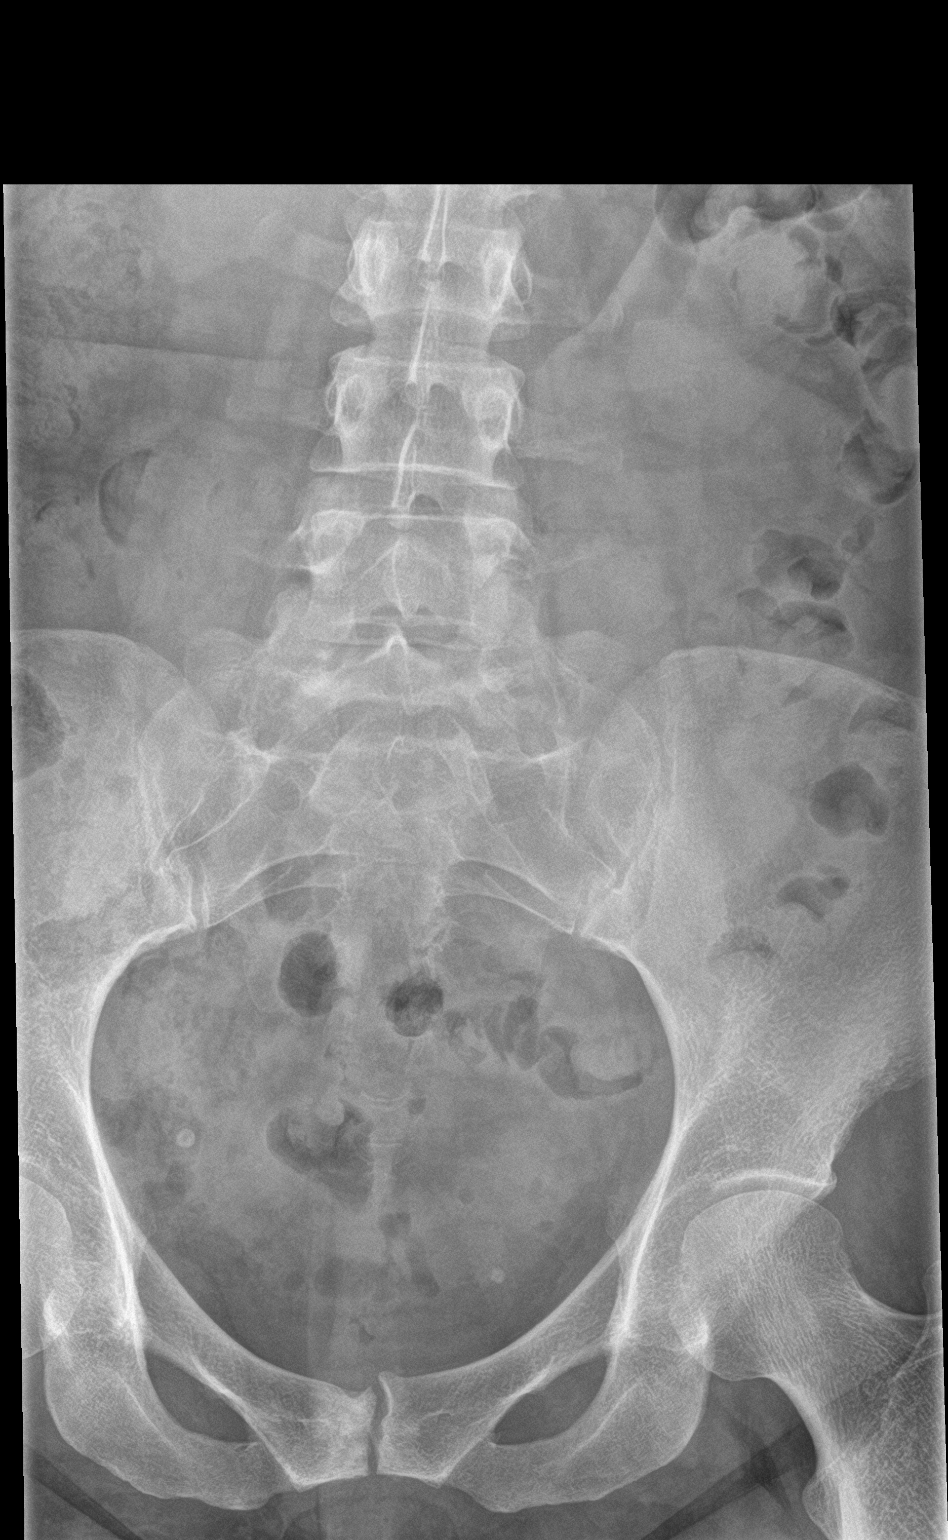

[l-spine lat]
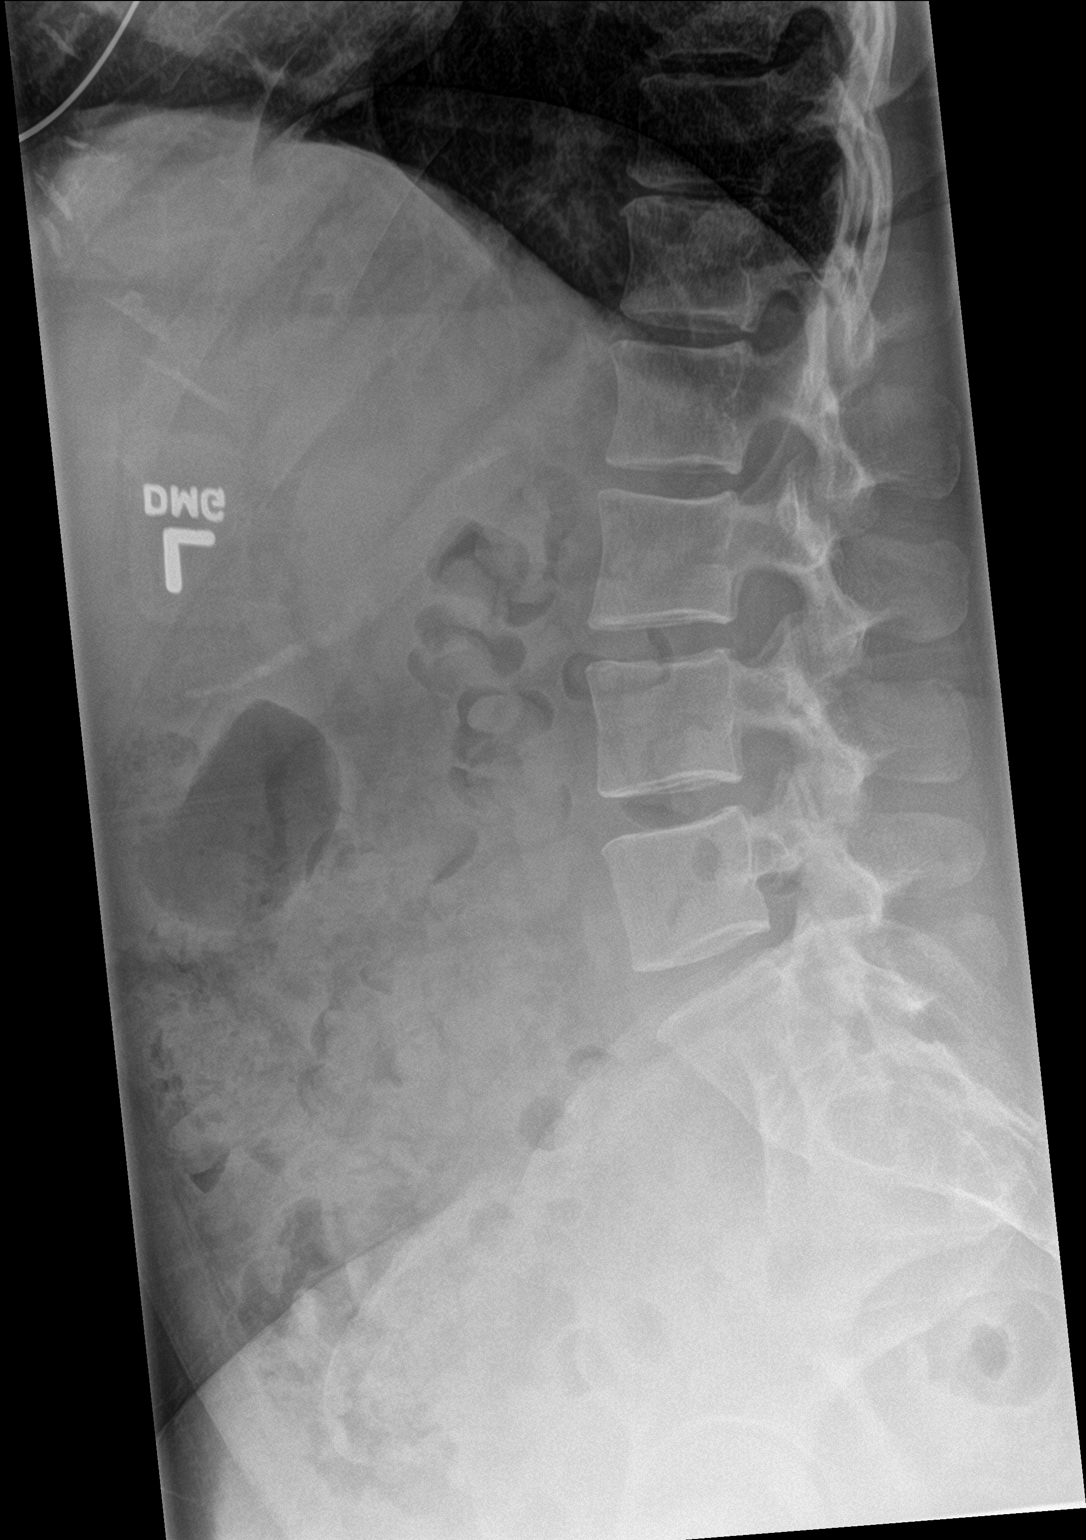

[l-spine spot]
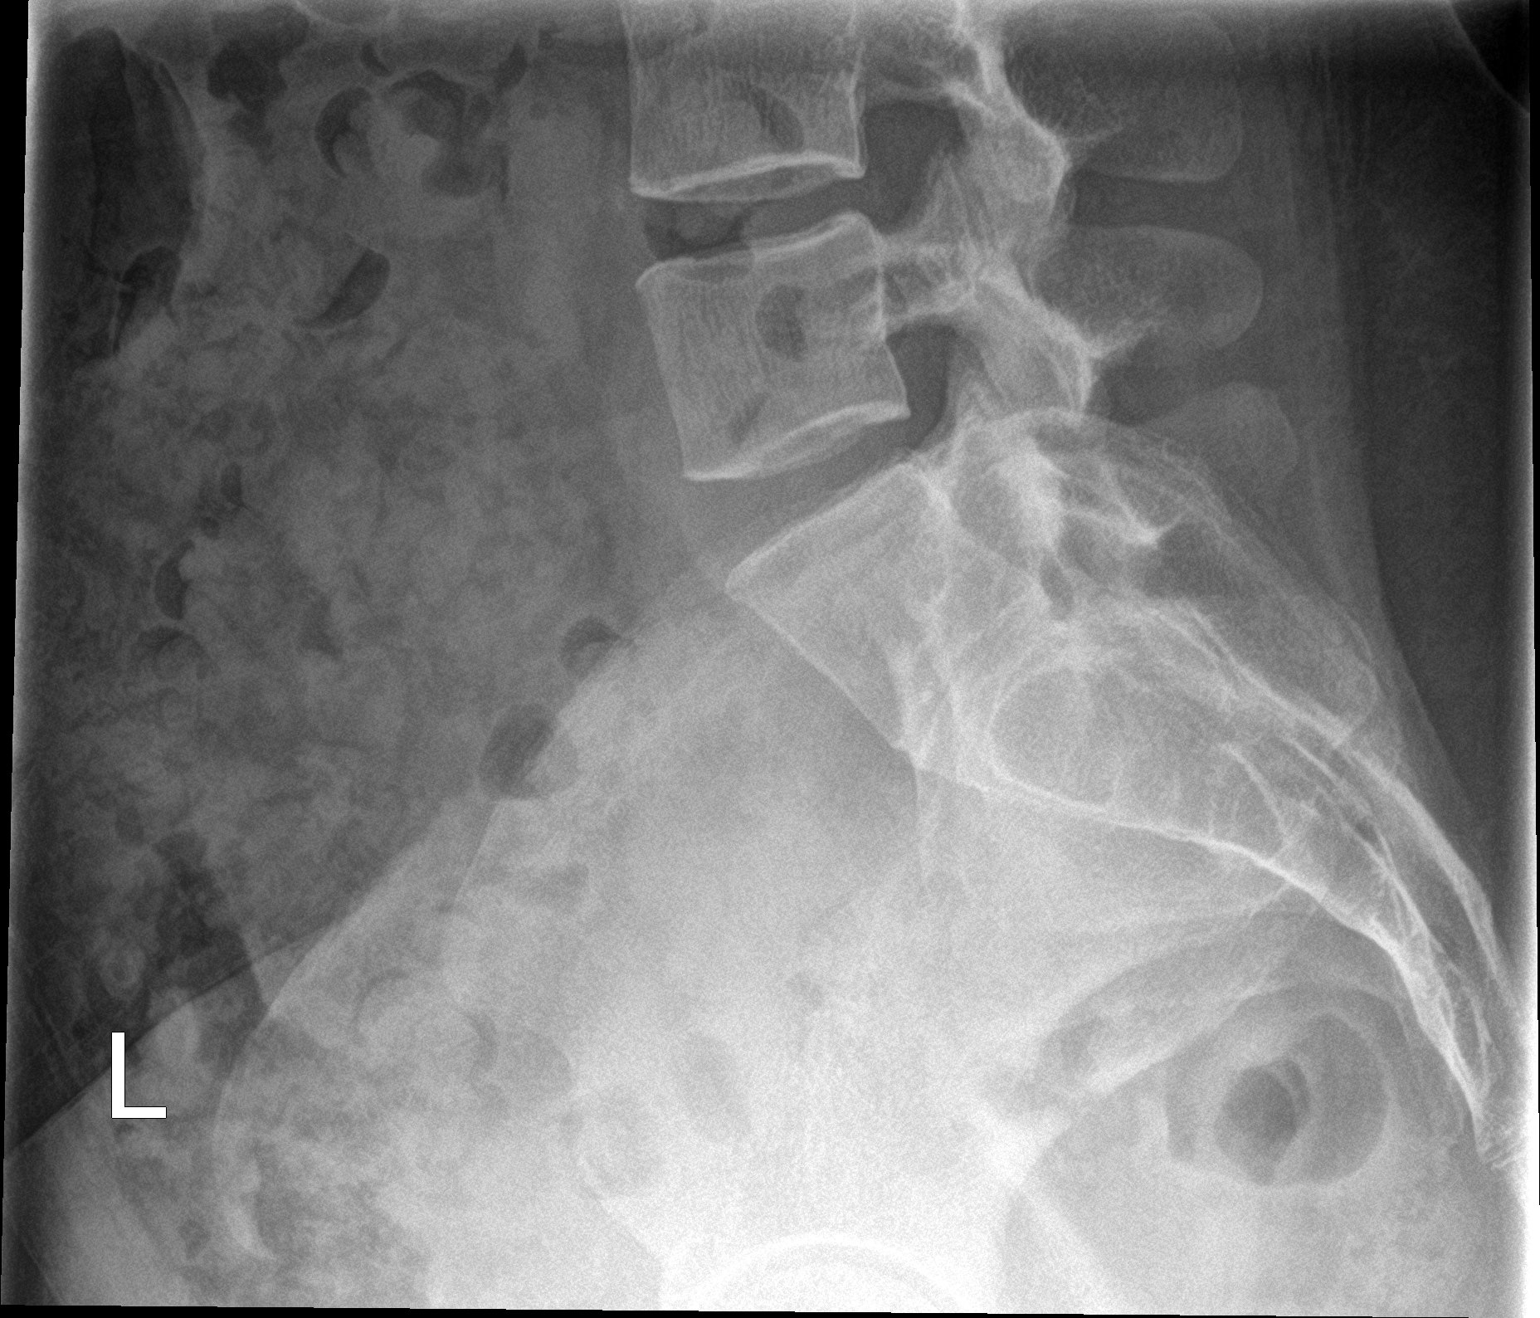

[3 of 3 positions shown; findings below may reference images not displayed]

FINDINGS: Normal alignment of lumbar vertebral bodies. No loss of vertebral
body height or disc height. No pars fracture. No subluxation.
IMPRESSION: No acute findings lumbar spine.  No significant arthropathy.

## 2019-12-19 ENCOUNTER — Other Ambulatory Visit: Payer: Self-pay

## 2019-12-19 ENCOUNTER — Encounter: Payer: Self-pay | Admitting: Adult Health

## 2019-12-19 ENCOUNTER — Ambulatory Visit (INDEPENDENT_AMBULATORY_CARE_PROVIDER_SITE_OTHER): Payer: 59 | Admitting: Adult Health

## 2019-12-19 VITALS — BP 129/65 | HR 76 | Ht 63.25 in | Wt 174.0 lb

## 2019-12-19 DIAGNOSIS — R4589 Other symptoms and signs involving emotional state: Secondary | ICD-10-CM | POA: Insufficient documentation

## 2019-12-19 DIAGNOSIS — R6882 Decreased libido: Secondary | ICD-10-CM | POA: Diagnosis not present

## 2019-12-19 DIAGNOSIS — Z01419 Encounter for gynecological examination (general) (routine) without abnormal findings: Secondary | ICD-10-CM | POA: Insufficient documentation

## 2019-12-19 DIAGNOSIS — R1032 Left lower quadrant pain: Secondary | ICD-10-CM | POA: Insufficient documentation

## 2019-12-19 DIAGNOSIS — R61 Generalized hyperhidrosis: Secondary | ICD-10-CM | POA: Diagnosis not present

## 2019-12-19 DIAGNOSIS — R140 Abdominal distension (gaseous): Secondary | ICD-10-CM

## 2019-12-19 NOTE — Progress Notes (Signed)
Patient ID: Jane Morgan, female   DOB: 26-Jun-1981, 39 y.o.   MRN: [id number] History of Present Illness: Jane Morgan is a 39 year old white female,single, sp hysterectomy and RSO in 2010, in for a well woman gyn exam. She is complaining of bloating and LLQ cramping, no change in bowels, no pain with sex but has decreased sex drive and night sweats and is moody.And has seemed to have weight esp around mid section. PCP is [facility].   Current Medications, Allergies, Past Medical History, Past Surgical History, Family History and Social History were reviewed in [employer] record.     Review of Systems: Patient denies any headaches, hearing loss, fatigue, blurred vision, shortness of breath, chest pain, problems with bowel movements, urination, or intercourse. No joint pain. See HPI for positives.    Physical Exam:BP 129/65 (BP Location: Left Arm, Patient Position: Sitting, Cuff Size: Normal)   Pulse 76   Ht 5' 3.25" (1.607 m)   Wt 174 lb (78.9 kg)   BMI 30.58 kg/m  General:  Well developed, well nourished, no acute distress Skin:  Warm and dry Neck:  Midline trachea, normal thyroid, good ROM, no lymphadenopathy Lungs; Clear to auscultation bilaterally Breast:  No dominant palpable mass, retraction, or nipple discharge Cardiovascular: Regular rate and rhythm Abdomen:  Soft, non tender, no hepatosplenomegaly Pelvic:  External genitalia is normal in appearance, no lesions.  The vagina is normal in appearance. Urethra has no lesions or masses. The cervix and uterus is absent. No adnexal masses, +tenderness across low pelvic area R>L noted.Bladder is mildly tender, no masses felt. Extremities/musculoskeletal:  No swelling or varicosities noted, no clubbing or cyanosis Psych:   alert and cooperative,seems happy PHQ 9 score is 4. Alcohol audit is 0. Examination chaperoned by Marchelle Folks Rash LPN   Impression and Plan:  1. Encounter for well woman exam with routine  gynecological exam Physical in 1 year Labs with PCP Mammogram at 39  2. Decreased sex drive  3. Night sweats  4. Moody  5. Bloating Return in 1 week for GYN [place] and then see me the next week to discuss results, if normal, may try ET   6. LLQ cramping GYN [place] in 1 week

## 2019-12-27 ENCOUNTER — Ambulatory Visit (INDEPENDENT_AMBULATORY_CARE_PROVIDER_SITE_OTHER): Payer: 59

## 2019-12-27 ENCOUNTER — Other Ambulatory Visit: Payer: Self-pay

## 2019-12-27 DIAGNOSIS — R1032 Left lower quadrant pain: Secondary | ICD-10-CM

## 2019-12-27 DIAGNOSIS — R140 Abdominal distension (gaseous): Secondary | ICD-10-CM

## 2019-12-27 NOTE — Progress Notes (Signed)
PELVIC US TA/TV: normal vaginal cuff,normal left ovary,left ovary appears mobile,fluid filled tube like structure in right adnexa,appears to slide with probe pressure 3.1 x 1.1 x 1.6 cm,no free fluid,bilat adnexal pain during ultrasound

## 2020-01-02 ENCOUNTER — Encounter: Payer: Self-pay | Admitting: Adult Health

## 2020-01-02 ENCOUNTER — Other Ambulatory Visit: Payer: Self-pay

## 2020-01-02 ENCOUNTER — Ambulatory Visit: Payer: 59 | Admitting: Adult Health

## 2020-01-02 VITALS — BP 124/82 | HR 81 | Ht 63.25 in | Wt 176.5 lb

## 2020-01-02 DIAGNOSIS — R140 Abdominal distension (gaseous): Secondary | ICD-10-CM

## 2020-01-02 DIAGNOSIS — R198 Other specified symptoms and signs involving the digestive system and abdomen: Secondary | ICD-10-CM | POA: Diagnosis not present

## 2020-01-02 DIAGNOSIS — R1032 Left lower quadrant pain: Secondary | ICD-10-CM

## 2020-01-02 NOTE — Progress Notes (Addendum)
  Subjective:     Patient ID: Jane Morgan, female   DOB: 1981/04/28, 39 y.o.   MRN: [id number]  HPI Jane Morgan is a 39 year old white female, sp hysterectomy, back in follow up on [place] for LLQ cramping and bloating. PCP is [place].   Review of Systems LLQ cramping and bloating  Reviewed past medical,surgical, social and family history. Reviewed medications and allergies.     Objective:   Physical Exam BP 124/82 (BP Location: Left Arm, Patient Position: Sitting, Cuff Size: Normal)   Pulse 81   Ht 5' 3.25" (1.607 m)   Wt 176 lb 8 oz (80.1 kg)   BMI 31.02 kg/m  Reviewed [place] with her, MD has not read yet. US showed right fluid filled tubular structure, 3.1 x 1.1 x 1.6 cm, normal vaginal cuff and normal left ovary    Assessment:     1. Right pelvic adnexal fluid collection Could be hydrosalpinx, from tube remnant but will get Dr Emelda Fear to read [place] tonight and we will talk in am  2. LLQ cramping  3. Bloating    Plan:     Will talk in am

## 2020-01-03 ENCOUNTER — Telehealth: Payer: Self-pay | Admitting: Adult Health

## 2020-01-03 NOTE — Telephone Encounter (Signed)
Called Tarrin to let her know Dr Emelda Fear read her [place] and that he thinks could be tubal remnant  with hydrosalpinx like we talked about and that it is stable from MRI in 2019, make appt with Dr Despina Hidden to discuss

## 2020-01-04 ENCOUNTER — Other Ambulatory Visit: Payer: Self-pay | Admitting: Orthopedic Surgery

## 2020-01-04 DIAGNOSIS — M25561 Pain in right knee: Secondary | ICD-10-CM

## 2020-01-10 ENCOUNTER — Telehealth (INDEPENDENT_AMBULATORY_CARE_PROVIDER_SITE_OTHER): Payer: 59 | Admitting: Obstetrics & Gynecology

## 2020-01-10 ENCOUNTER — Encounter: Payer: Self-pay | Admitting: Obstetrics & Gynecology

## 2020-01-10 ENCOUNTER — Other Ambulatory Visit: Payer: Self-pay

## 2020-01-10 DIAGNOSIS — R1032 Left lower quadrant pain: Secondary | ICD-10-CM | POA: Diagnosis not present

## 2020-01-10 DIAGNOSIS — N951 Menopausal and female climacteric states: Secondary | ICD-10-CM

## 2020-01-10 MED ORDER — ESTRADIOL 2 MG PO TABS: 2.0000 mg | ORAL_TABLET | Freq: Every day | ORAL | 6 refills | Status: AC

## 2020-01-10 NOTE — Progress Notes (Signed)
Follow up appointment for results via MyChart Connect  Chief Complaint  Patient presents with  . Follow-up    discuss [place] results    There were no vitals taken for this visit.  GYNECOLOGIC SONOGRAM   Jane Morgan is a 39 y.o. [id number] No LMP recorded. Patient has had a hysterectomy. and right oophorectomy. She is here for a pelvic sonogram for LLQ pain .  Uterus                   Surgically removed,normal vaginal cuff  Endometrium         N/A  Right ovary             Right oophorectomy,fluid filled tube like structure in right adnexa,appears to slide with probe pressure 3.1 x 1.1 x 1.6 cm  Left ovary                3.7 x 4.1 x 1.8 cm, WNL  No free fluid   Technician Comments:  PELVIC US TA/TV: normal vaginal cuff,normal left ovary,left ovary appears mobile,fluid filled tube like structure in right adnexa,appears to slide with probe pressure 3.1 x 1.1 x 1.6 cm,no free fluid,bilat adnexal pain during ultrasound   Chaperone:Sabrina  Amber Flora Lipps 12/27/2019 5:18 PM  Clinical Impression and recommendations:  I have reviewed the sonogram results above.They are compared with the MRI of 2019.  Combined with the patient's current clinical course, below are my impressions and any appropriate recommendations for management based on the sonographic findings:   FINDINGS:                               Maternal uterus surgically absent, and the vaginal cuff is smooth, of good mobility on dynamic probe manipulation  /adnexae: Ovary on left is smooth, mobile and in appropriate position on the left pelvic sidewall and is not attached to the vaginal cuff, has several small follicular cysts  within normal limits size , the largest is 11 mm size .                                                                       Right ovary surgically absent. With a small tubular structure on the right pelvic sidewall that appears mobile, not attached to the vaginal cuff, somewhat  tortuous , differential includes a distal tubal remnant, a peritoneal inclusion cyst, or cystic ovarian remnant.  I favor a tubal remnant. OF NOTE, this structure is seen on MRI photos from 08/2018 and is unchanged in size. Series 2 image 19/42.                                                                       Left ovary    As above  Free Fluid   None of significance.   IMPRESSION:                                       1 pelvis with mobile pelvic structures, smooth thin vaginal cuff, without identified pelvic adhesions, a normal appearing left ovary, and persistent stable asymptomatic right adnexal cystic structure, consistent with distal tubal remnant with hydrosalpinx, paratubal cyst or ovarian remnant.  Jonnie Kind 01/02/2020     MEDS ordered this encounter: Meds ordered this encounter  Medications  . estradiol (ESTRACE) 2 MG tablet    Sig: Take 1 tablet (2 mg total) by mouth daily.    Dispense:  30 tablet    Refill:  6    Orders for this encounter: No orders of the defined types were placed in this encounter.   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       Video Face to face time:  10 minutes  Greater than 50% of the visit time was spent in counseling and coordination of care with the patient.  The summary and outline of the counseling and care coordination is summarized in the note above.   All questions were answered.  Past Medical History:  Diagnosis Date  . Depression   . Hematuria 06/25/2013   UA C&S will rx septra ds  . Left breast mass 03/05/2013  . Other and unspecified ovarian cyst  03/14/2013   Complex left ovarian cyst seen on [place]  . Painful urination 06/25/2013  . Vaginal discharge 06/25/2013  . Vaginal irritation 06/25/2013  . Vitamin D deficiency     Past Surgical History:  Procedure Laterality Date  . BREAST BIOPSY    . TOTAL VAGINAL HYSTERECTOMY      OB History    Gravida  4   Para  3   Term  2   Preterm  1   AB  1   Living        SAB  1   TAB      Ectopic      Multiple      Live Births              No Known Allergies  Social History   Socioeconomic History  . Marital status: Single    Spouse name: Not on file  . Number of children: Not on file  . Years of education: Not on file  . Highest education level: Not on file  Occupational History  . Not on file  Tobacco Use  . Smoking status: Current Every Day Smoker    Packs/day: 1.00    Years: 15.00    Pack years: 15.00    Types: Cigarettes  . Smokeless tobacco: Never Used  Substance and Sexual Activity  . Alcohol use: No  . Drug use: No  . Sexual activity: Yes    Birth control/protection: Surgical    Comment: hyst  Other Topics Concern  . Not on file  Social History Narrative  . Not on file   Social Determinants of Health   Financial Resource Strain: Low Risk   .  Difficulty of Paying Living Expenses: Not very hard  Food Insecurity: No Food Insecurity  . Worried About Programme researcher, broadcasting/film/video in the Last Year: Never true  . Ran Out of Food in the Last Year: Never true  Transportation Needs: No Transportation Needs  . Lack of Transportation (Medical): No  . Lack of Transportation (Non-Medical): No  Physical Activity: Inactive  . Days of Exercise per Week: 0 days  . Minutes of Exercise per Session: 0 min  Stress:   . Feeling of Stress :   Social Connections: Moderately Isolated  . Frequency of Communication with Friends and Family: More than three times a week  . Frequency of Social Gatherings with Friends and Family: Never  . Attends Religious Services: Never   . Active Member of Clubs or Organizations: No  . Attends Banker Meetings: Never  . Marital Status: Never married    Family History  Problem Relation Age of Onset  . Cancer Maternal Grandmother        breast  . Diabetes Maternal Grandmother   . Hypertension Maternal Grandmother   . Breast cancer Maternal Grandmother   . Cancer Maternal Aunt        breast  . Cancer Maternal Grandfather   . Ovarian cancer Other   . Lung cancer Other   . Hypertension Other   . Coronary artery disease Other   . Diabetes Other   . Aneurysm Other

## 2021-10-05 ENCOUNTER — Other Ambulatory Visit: Payer: Self-pay | Admitting: Orthopedic Surgery

## 2021-10-05 ENCOUNTER — Other Ambulatory Visit (HOSPITAL_COMMUNITY): Payer: Self-pay | Admitting: Orthopedic Surgery

## 2021-10-05 DIAGNOSIS — M5416 Radiculopathy, lumbar region: Secondary | ICD-10-CM

## 2021-10-15 ENCOUNTER — Ambulatory Visit (HOSPITAL_COMMUNITY)
Admission: RE | Admit: 2021-10-15 | Discharge: 2021-10-15 | Disposition: A | Discharge status: Home / Self Care | Payer: 59 | Source: Ambulatory Visit | Attending: Orthopedic Surgery | Admitting: Orthopedic Surgery

## 2021-10-15 ENCOUNTER — Other Ambulatory Visit: Payer: Self-pay

## 2021-10-15 DIAGNOSIS — M5416 Radiculopathy, lumbar region: Secondary | ICD-10-CM | POA: Diagnosis present

## 2021-10-15 IMAGING — MR MR LUMBAR SPINE W/O CM
5 series · 31 of 48 positions shown · non-contrast
Comparison: X-ray lumbar 11/19/2019.

CLINICAL DATA: Neck, back, right hip and knee pain with numbness in
bilateral toes for 2 years. Patient reports history of a fall 3-4
years ago.

EXAM:
MRI LUMBAR SPINE WITHOUT CONTRAST
TECHNIQUE: Multiplanar, multisequence MR imaging of the lumbar spine was
performed. No intravenous contrast was administered.

[Series 5: T2 · sagittal · 4.0mm · 0.68mm/px · 6 of 15 slices shown (1 of 2)]
[im 1/15]
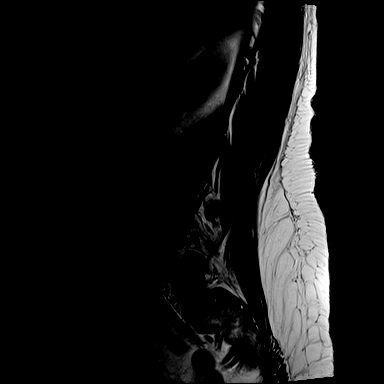
[im 3/15]
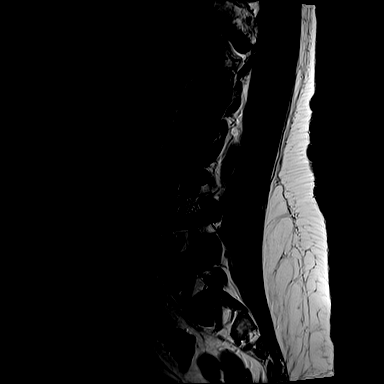
[im 6/15]
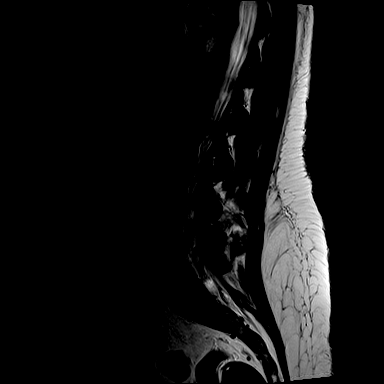
[im 9/15]
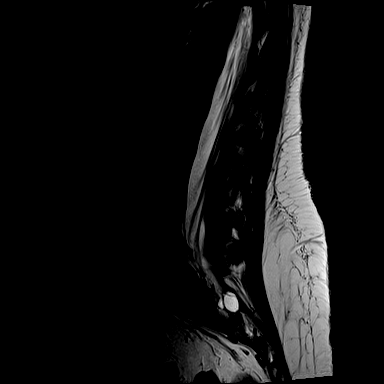
[im 12/15]
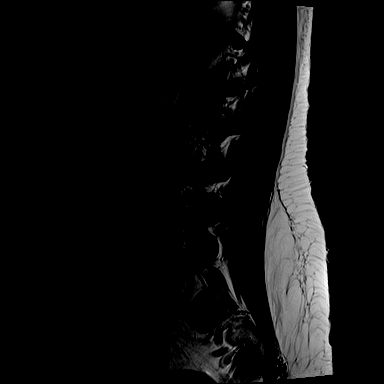
[im 15/15]
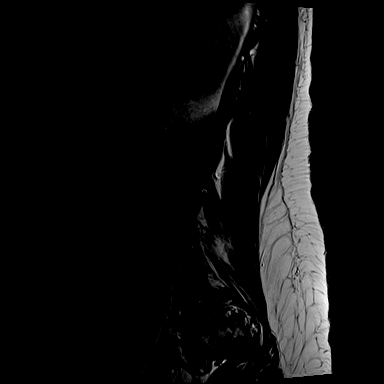

[Series 6: T1 · sagittal · 4.0mm · 0.81mm/px · 7 of 15 slices shown (1 of 2)]
[im 1/15]
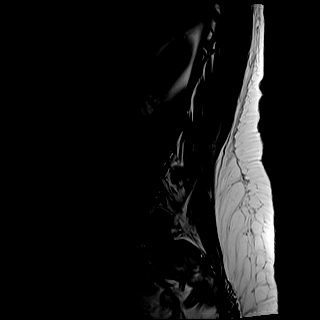
[im 3/15]
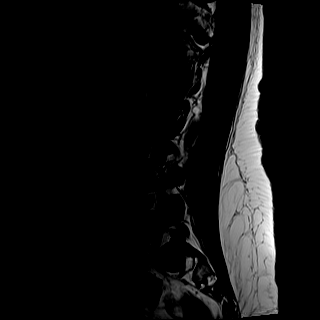
[im 5/15]
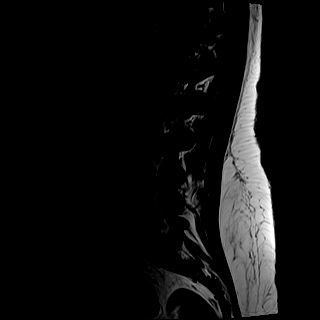
[im 8/15]
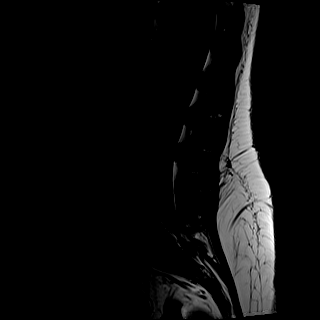
[im 10/15]
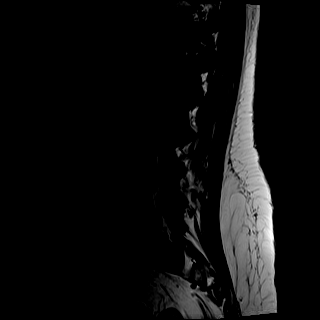
[im 12/15]
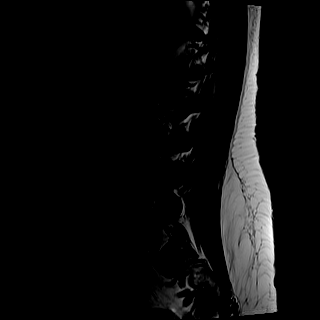
[im 15/15]
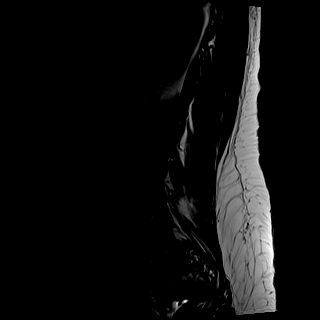

[Series 7: STIR · sagittal · 4.0mm · 0.51mm/px · 2 of 15 slices shown]
[im 1/15]
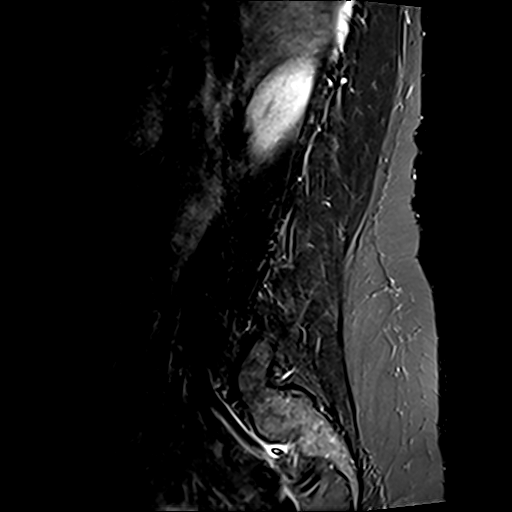
[im 3/15]
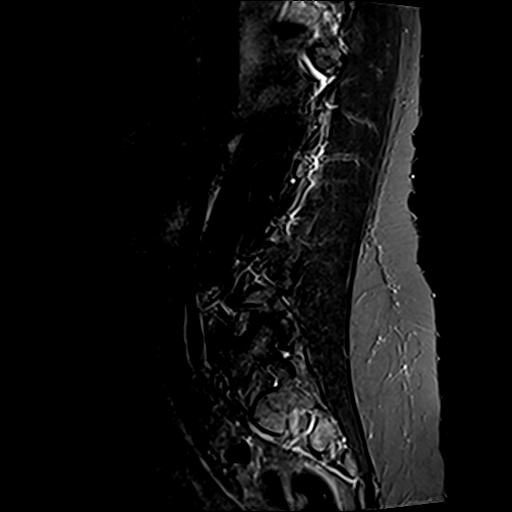

[Series 8: T2 · axial · 4.0mm · 0.70mm/px · z∈[-101,+69]mm · 8 of 30 slices shown (2 of 2)]
[im 1/30]
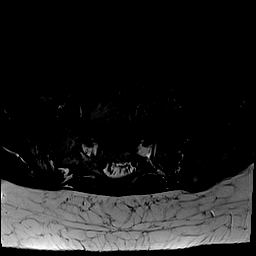
[im 5/30]
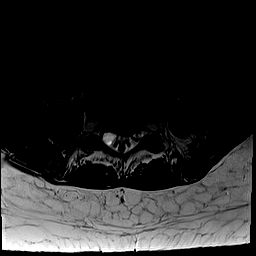
[im 9/30]
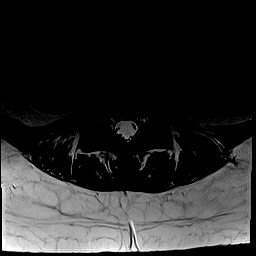
[im 14/30]
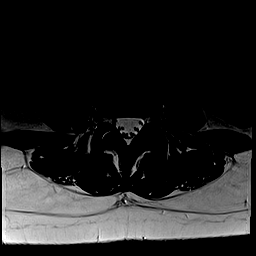
[im 16/30]
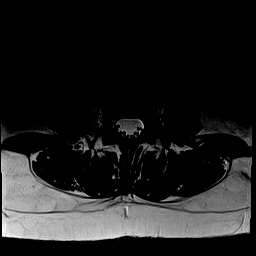
[im 21/30]
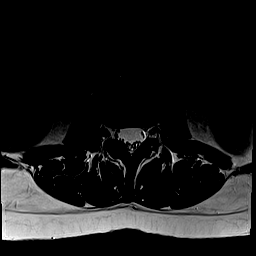
[im 25/30]
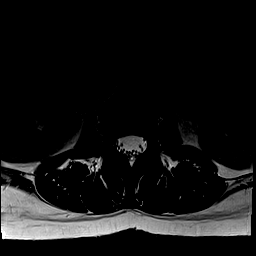
[im 30/30]
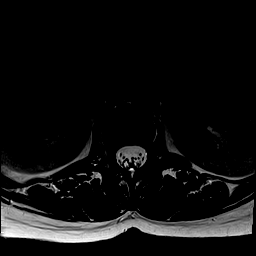

[Series 9: T1 · axial · 4.0mm · 0.35mm/px · z∈[-101,+69]mm · 8 of 30 slices shown (2 of 2)]
[im 1/30]
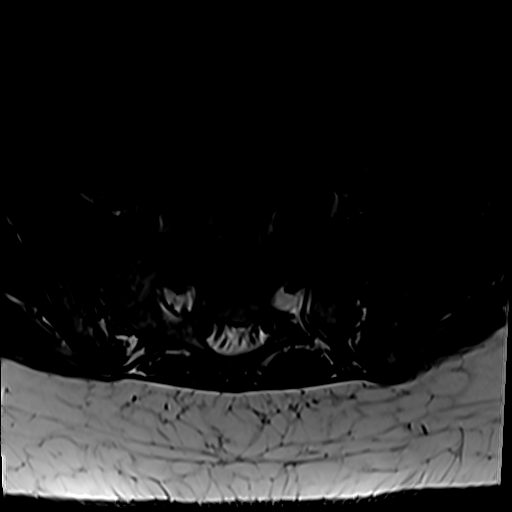
[im 5/30]
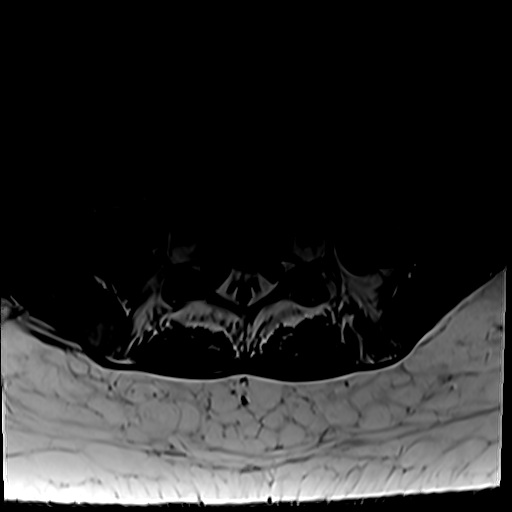
[im 9/30]
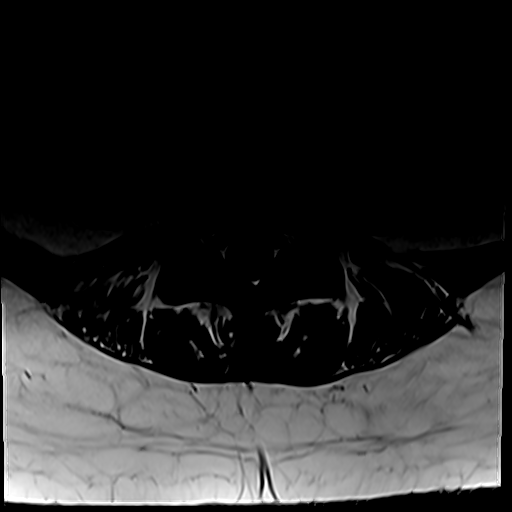
[im 14/30]
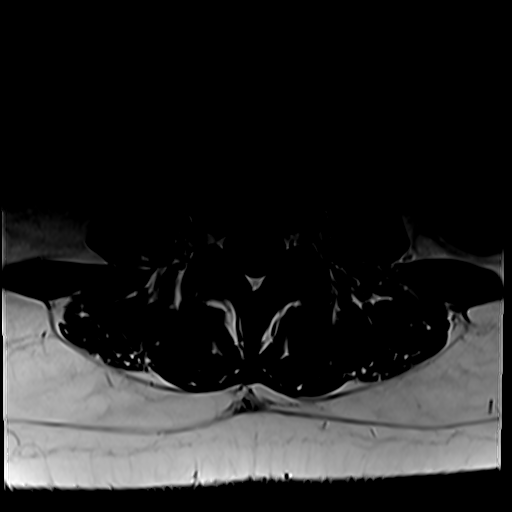
[im 16/30]
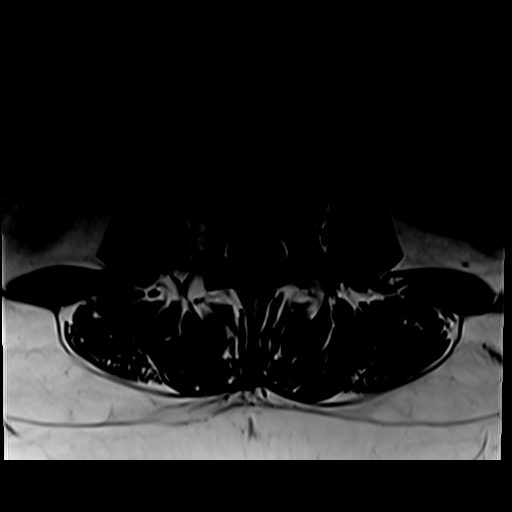
[im 21/30]
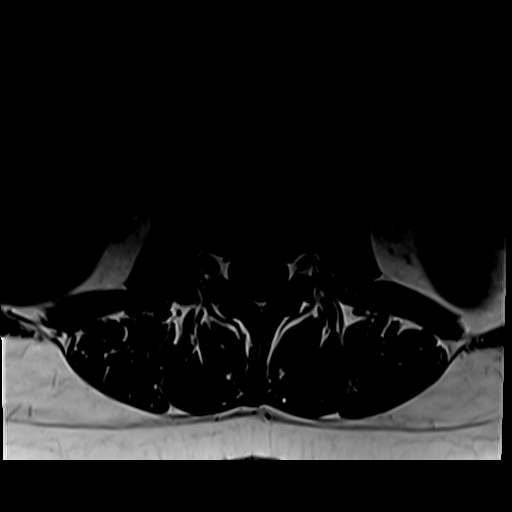
[im 25/30]
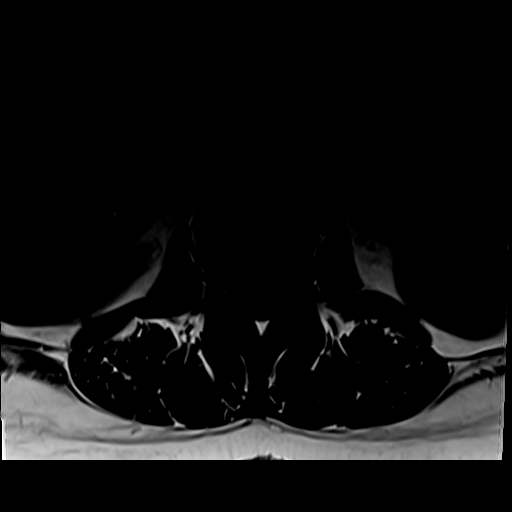
[im 30/30]
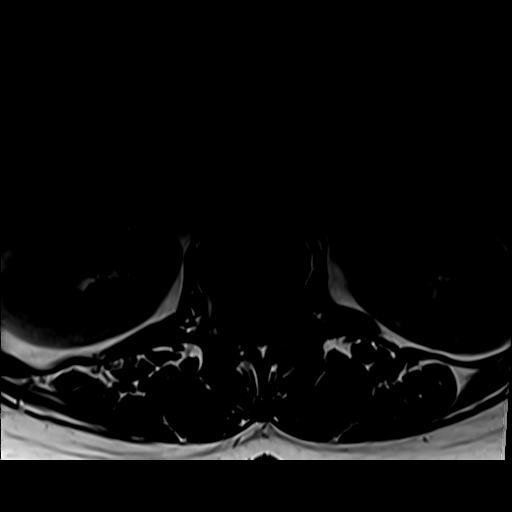

[31 of 48 positions shown; findings below may reference images not displayed]

FINDINGS: Segmentation: There is transitional lumbosacral anatomy with 4 non
rib-bearing lumbar vertebrae and a transitional L5 with bilateral
assimilation joints. There is a rudimentary L5-S1 disc space.

Alignment:  Normal

Vertebrae:  No fracture, evidence of discitis, or bone lesion.

Conus medullaris and cauda equina: Conus extends to the L1 level.
Conus and cauda equina appear normal.

Paraspinal and other soft tissues: Normal

Disc levels:

The disc levels from T11-L4 are normal.

L4-L5: There is a small central disc protrusion with mild facet
hypertrophy. No spinal canal stenosis. No neural foraminal stenosis.

L5-S1: Rudimentary disc space without abnormality. Incidentally
noted perineural cysts arising at the lower S1 level. No spinal
canal stenosis. No neural foraminal stenosis.

Visualized sacrum: Normal.
IMPRESSION: 1. Transitional lumbosacral anatomy with 4 non rib-bearing lumbar
vertebrae and a transitional L5 with bilateral assimilation joints.
2. Mild L4-L5 degenerative disc disease without spinal canal or
neural foraminal stenosis.
